# Patient Record
Sex: Male | Born: 1995
Health system: Southern US, Community
[De-identification: ages and names within clinical notes are randomized; demographics above are authoritative.]

## PROBLEM LIST (undated history)

## (undated) DIAGNOSIS — J302 Other seasonal allergic rhinitis: Secondary | ICD-10-CM

## (undated) DIAGNOSIS — Z87898 Personal history of other specified conditions: Secondary | ICD-10-CM

## (undated) DIAGNOSIS — F913 Oppositional defiant disorder: Secondary | ICD-10-CM

## (undated) DIAGNOSIS — R569 Unspecified convulsions: Secondary | ICD-10-CM

## (undated) DIAGNOSIS — F329 Major depressive disorder, single episode, unspecified: Secondary | ICD-10-CM

## (undated) DIAGNOSIS — T783XXA Angioneurotic edema, initial encounter: Secondary | ICD-10-CM

## (undated) DIAGNOSIS — F32A Depression, unspecified: Secondary | ICD-10-CM

## (undated) DIAGNOSIS — L509 Urticaria, unspecified: Secondary | ICD-10-CM

## (undated) DIAGNOSIS — Z973 Presence of spectacles and contact lenses: Secondary | ICD-10-CM

## (undated) HISTORY — DX: Other seasonal allergic rhinitis: J30.2

## (undated) HISTORY — DX: Unspecified convulsions: R56.9

## (undated) HISTORY — DX: Oppositional defiant disorder: F91.3

## (undated) HISTORY — DX: Personal history of other specified conditions: Z87.898

## (undated) HISTORY — DX: Major depressive disorder, single episode, unspecified: F32.9

## (undated) HISTORY — DX: Depression, unspecified: F32.A

## (undated) HISTORY — DX: Angioneurotic edema, initial encounter: T78.3XXA

## (undated) HISTORY — PX: OTHER SURGICAL HISTORY: SHX169

## (undated) HISTORY — DX: Presence of spectacles and contact lenses: Z97.3

## (undated) HISTORY — DX: Urticaria, unspecified: L50.9

---
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
## 2014-05-02 ENCOUNTER — Telehealth: Payer: Self-pay | Admitting: Family Medicine

## 2014-05-02 MED ORDER — PREDNISONE 20 MG PO TABS: ORAL_TABLET | ORAL | Status: DC

## 2014-05-02 NOTE — Telephone Encounter (Signed)
Patient says he has poison oak on his neck, waist, hands, arms, chest. Wants something called in please.  [facility]

## 2014-05-02 NOTE — Telephone Encounter (Signed)
Itchy Rash started thurs. Was exposed to poison oak.

## 2014-05-02 NOTE — Telephone Encounter (Signed)
Nurses, and there are options regarding poison oak certainly can do a steroid cream or steroid tablets. Steroid tablets work faster. If he would like to do that please do 9 days standard taper. 20 mg,3qd for 3d then 2qd for 3d then 1qd for 3d Followup if ongoing trouble

## 2014-05-02 NOTE — Telephone Encounter (Signed)
Medication sent to pharmacy. Patient was notified.  

## 2015-01-11 ENCOUNTER — Encounter: Payer: Self-pay | Admitting: Family Medicine

## 2015-01-11 ENCOUNTER — Ambulatory Visit (INDEPENDENT_AMBULATORY_CARE_PROVIDER_SITE_OTHER): Payer: BLUE CROSS/BLUE SHIELD | Admitting: Family Medicine

## 2015-01-11 VITALS — BP 112/72 | Ht 66.75 in | Wt 149.0 lb

## 2015-01-11 DIAGNOSIS — Z0000 Encounter for general adult medical examination without abnormal findings: Secondary | ICD-10-CM | POA: Diagnosis not present

## 2015-01-11 NOTE — Progress Notes (Signed)
   Subjective:    Patient ID: [name], male    DOB: 16-Feb-1996, 19 y.o.   MRN: [id number]  HPI The patient comes in today for a wellness visit.  A review of their health history was completed.  A review of medications was also completed.  Any needed refills: No  Eating habits: Health conscious  Falls/  MVA accidents in past few months: No  Regular exercise: Yes  Specialist pt sees on regular basis: No  Preventative health issues were discussed.   Additional concerns: Needs physical form filled out     Review of Systems  Constitutional: Negative for fever, activity change and appetite change.  HENT: Negative for congestion and rhinorrhea.   Eyes: Negative for discharge.  Respiratory: Negative for cough and wheezing.   Cardiovascular: Negative for chest pain.  Gastrointestinal: Negative for vomiting, abdominal pain and blood in stool.  Genitourinary: Negative for frequency and difficulty urinating.  Musculoskeletal: Negative for neck pain.  Skin: Negative for rash.  Allergic/Immunologic: Negative for environmental allergies and food allergies.  Neurological: Negative for weakness and headaches.  Psychiatric/Behavioral: Negative for agitation.  All other systems reviewed and are negative.      Objective:   Physical Exam  Constitutional: He appears well-developed and well-nourished.  HENT:  Head: Normocephalic and atraumatic.  Right Ear: External ear normal.  Left Ear: External ear normal.  Nose: Nose normal.  Mouth/Throat: Oropharynx is clear and moist.  Eyes: EOM are normal. Pupils are equal, round, and reactive to light.  Neck: Normal range of motion. Neck supple. No thyromegaly present.  Cardiovascular: Normal rate, regular rhythm and normal heart sounds.   No murmur heard. Pulmonary/Chest: Effort normal and breath sounds normal. No respiratory distress. He has no wheezes.  Abdominal: Soft. Bowel sounds are normal. He exhibits no distension and no  mass. There is no tenderness.  Genitourinary: Penis normal.  Musculoskeletal: Normal range of motion. He exhibits no edema.  Lymphadenopathy:    He has no cervical adenopathy.  Neurological: He is alert. He exhibits normal muscle tone.  Skin: Skin is warm and dry. No erythema.  Psychiatric: He has a normal mood and affect. His behavior is normal. Judgment normal.  Vitals reviewed.         Assessment & Plan:  Impression 1 wellness exam #2 history of possible seizure 5 years ago. Negative workup including negative EEG and negative neurology referral. Absolutely no neurological symptoms since then plan diet discussed exercise discussed anticipatory guidance given. Physical form filled out WSL

## 2015-07-17 ENCOUNTER — Ambulatory Visit: Payer: BLUE CROSS/BLUE SHIELD | Admitting: Podiatry

## 2015-12-29 ENCOUNTER — Emergency Department (HOSPITAL_COMMUNITY)
Admission: EM | Admit: 2015-12-29 | Discharge: 2015-12-29 | Disposition: A | Discharge status: Home / Self Care | Payer: BLUE CROSS/BLUE SHIELD | Attending: Emergency Medicine | Admitting: Emergency Medicine

## 2015-12-29 ENCOUNTER — Encounter (HOSPITAL_COMMUNITY): Payer: Self-pay | Admitting: *Deleted

## 2015-12-29 DIAGNOSIS — F17200 Nicotine dependence, unspecified, uncomplicated: Secondary | ICD-10-CM | POA: Insufficient documentation

## 2015-12-29 DIAGNOSIS — G40909 Epilepsy, unspecified, not intractable, without status epilepticus: Secondary | ICD-10-CM | POA: Insufficient documentation

## 2015-12-29 DIAGNOSIS — F329 Major depressive disorder, single episode, unspecified: Secondary | ICD-10-CM | POA: Insufficient documentation

## 2015-12-29 DIAGNOSIS — R519 Headache, unspecified: Secondary | ICD-10-CM

## 2015-12-29 DIAGNOSIS — B349 Viral infection, unspecified: Secondary | ICD-10-CM | POA: Diagnosis not present

## 2015-12-29 DIAGNOSIS — R51 Headache: Secondary | ICD-10-CM | POA: Insufficient documentation

## 2015-12-29 LAB — CBC WITH DIFFERENTIAL/PLATELET
BASOS ABS: 0 10*3/uL (ref 0.0–0.1)
Basophils Relative: 0 %
EOS PCT: 1 %
Eosinophils Absolute: 0.1 10*3/uL (ref 0.0–0.7)
HEMATOCRIT: 43.3 % (ref 39.0–52.0)
HEMOGLOBIN: 15.7 g/dL (ref 13.0–17.0)
Lymphocytes Relative: 23 %
Lymphs Abs: 2 10*3/uL (ref 0.7–4.0)
MCH: 32.2 pg (ref 26.0–34.0)
MCHC: 36.3 g/dL — ABNORMAL HIGH (ref 30.0–36.0)
MCV: 88.7 fL (ref 78.0–100.0)
Monocytes Absolute: 0.5 10*3/uL (ref 0.1–1.0)
Monocytes Relative: 6 %
Neutro Abs: 6.1 10*3/uL (ref 1.7–7.7)
Neutrophils Relative %: 70 %
PLATELETS: 202 10*3/uL (ref 150–400)
RBC: 4.88 MIL/uL (ref 4.22–5.81)
RDW: 11.3 % — ABNORMAL LOW (ref 11.5–15.5)
WBC: 8.7 10*3/uL (ref 4.0–10.5)

## 2015-12-29 LAB — BASIC METABOLIC PANEL
ANION GAP: 8 (ref 5–15)
BUN: 14 mg/dL (ref 6–20)
CHLORIDE: 102 mmol/L (ref 101–111)
CO2: 28 mmol/L (ref 22–32)
Calcium: 8.8 mg/dL — ABNORMAL LOW (ref 8.9–10.3)
Creatinine, Ser: 0.97 mg/dL (ref 0.61–1.24)
GFR calc Af Amer: >60 mL/min (ref >60)
GLUCOSE: 95 mg/dL (ref 65–99)
POTASSIUM: 3.7 mmol/L (ref 3.5–5.1)
Sodium: 138 mmol/L (ref 135–145)

## 2015-12-29 LAB — RAPID STREP SCREEN (MED CTR MEBANE ONLY): STREPTOCOCCUS, GROUP A SCREEN (DIRECT): NEGATIVE

## 2015-12-29 MED ORDER — DIPHENHYDRAMINE HCL 25 MG PO CAPS
25.0000 mg | ORAL_CAPSULE | Freq: Once | ORAL | Status: AC
  Administered 2015-12-29: 25 mg via ORAL
  Filled 2015-12-29: qty 1

## 2015-12-29 MED ORDER — OXYCODONE-ACETAMINOPHEN 5-325 MG PO TABS: 1.0000 | ORAL_TABLET | ORAL | Status: DC | PRN

## 2015-12-29 MED ORDER — METOCLOPRAMIDE HCL 5 MG/ML IJ SOLN
10.0000 mg | Freq: Once | INTRAMUSCULAR | Status: AC
  Administered 2015-12-29: 10 mg via INTRAMUSCULAR
  Filled 2015-12-29: qty 2

## 2015-12-29 MED ORDER — KETOROLAC TROMETHAMINE 60 MG/2ML IM SOLN
60.0000 mg | Freq: Once | INTRAMUSCULAR | Status: AC
  Administered 2015-12-29: 60 mg via INTRAMUSCULAR
  Filled 2015-12-29: qty 2

## 2015-12-29 MED ORDER — OXYCODONE-ACETAMINOPHEN 5-325 MG PO TABS
1.0000 | ORAL_TABLET | Freq: Once | ORAL | Status: AC
  Administered 2015-12-29: 1 via ORAL
  Filled 2015-12-29: qty 1

## 2015-12-29 NOTE — Discharge Instructions (Signed)
General Headache Without Cause A headache is pain or discomfort felt around the head or neck area. There are many causes and types of headaches. In some cases, the cause may not be found.  HOME CARE  Managing Pain  Take over-the-counter and prescription medicines only as told by your doctor.  Lie down in a dark, quiet room when you have a headache.  If directed, apply ice to the head and neck area:  Put ice in a plastic bag.  Place a towel between your skin and the bag.  Leave the ice on for 20 minutes, 2-3 times per day.  Use a heating pad or hot shower to apply heat to the head and neck area as told by your doctor.  Keep lights dim if bright lights bother you or make your headaches worse. Eating and Drinking  Eat meals on a regular schedule.  Lessen how much alcohol you drink.  Lessen how much caffeine you drink, or stop drinking caffeine. General Instructions  Keep all follow-up visits as told by your doctor. This is important.  Keep a journal to find out if certain things bring on headaches. For example, write down:  What you eat and drink.  How much sleep you get.  Any change to your diet or medicines.  Relax by getting a massage or doing other relaxing activities.  Lessen stress.  Sit up straight. Do not tighten (tense) your muscles.  Do not use tobacco products. This includes cigarettes, chewing tobacco, or e-cigarettes. If you need help quitting, ask your doctor.  Exercise regularly as told by your doctor.  Get enough sleep. This often means 7-9 hours of sleep. GET HELP IF:  Your symptoms are not helped by medicine.  You have a headache that feels different than the other headaches.  You feel sick to your stomach (nauseous) or you throw up (vomit).  You have a fever. GET HELP RIGHT AWAY IF:   Your headache becomes really bad.  You keep throwing up.  You have a stiff neck.  You have trouble seeing.  You have trouble speaking.  You have  pain in the eye or ear.  Your muscles are weak or you lose muscle control.  You lose your balance or have trouble walking.  You feel like you will pass out (faint) or you pass out.  You have confusion.   This information is not intended to replace advice given to you by your health care provider. Make sure you discuss any questions you have with your health care provider.   Document Released: 07/16/2008 Document Revised: 06/28/2015 Document Reviewed: 01/30/2015 Elsevier Interactive Patient Education 2016 Else[employer] Inc.  Viral Infections A virus is a type of germ. Viruses can cause:  Minor sore throats.  Aches and pains.  Headaches.  Runny nose.  Rashes.  Watery eyes.  Tiredness.  Coughs.  Loss of appetite.  Feeling sick to your stomach (nausea).  Throwing up (vomiting).  Watery poop (diarrhea). HOME CARE   Only take medicines as told by your doctor.  Drink enough water and fluids to keep your pee (urine) clear or pale yellow. Sports drinks are a good choice.  Get plenty of rest and eat healthy. Soups and broths with crackers or rice are fine. GET HELP RIGHT AWAY IF:   You have a very bad headache.  You have shortness of breath.  You have chest pain or neck pain.  You have an unusual rash.  You cannot stop throwing up.  You have  watery poop that does not stop.  You cannot keep fluids down.  You or your child has a temperature by mouth above 102 F (38.9 C), not controlled by medicine.  Your baby is older than 3 months with a rectal temperature of 102 F (38.9 C) or higher.  Your baby is 47 months old or younger with a rectal temperature of 100.4 F (38 C) or higher. MAKE SURE YOU:   Understand these instructions.  Will watch this condition.  Will get help right away if you are not doing well or get worse.   This information is not intended to replace advice given to you by your health care provider. Make sure you discuss any questions you  have with your health care provider.   Document Released: 09/19/2008 Document Revised: 12/30/2011 Document Reviewed: 03/15/2015 Elsevier Interactive Patient Education [employer]

## 2015-12-29 NOTE — ED Notes (Addendum)
Pt c/o headache, cough, nausea, body aches and fever. Pt reports numbness in random areas of his body. X 2 days. Sore throat x 2 days ago which has subsided now.

## 2016-01-01 MED FILL — Oxycodone w/ Acetaminophen Tab 5-325 MG: ORAL | Qty: 6 | Status: AC

## 2016-01-01 NOTE — ED Provider Notes (Signed)
CSN: [id number]     Arrival date & time 12/29/15  1846 History   First MD Initiated Contact with Patient 12/29/15 2054     Chief Complaint  Patient presents with  . Headache     (Consider location/radiation/quality/duration/timing/severity/associated sxs/prior Treatment) HPI   [name] is a 20 y.o. male who presents to the Emergency Department complaining of sore throat, cough, generalized body aches and fever that began two days prior.  He states that sore throat has improved.  He continues to report intermittent cough, nausea and waxing and waning headache.  He describes a throbbing frontal headache that feels like it wraps around his head and associated with photophobia.  He has not taken any medications for symptom relief.  He denies dizziness, neck pain or stiffness, shortness of breath, chest or abd pain.     Past Medical History  Diagnosis Date  . Oppositional defiant disorder   . Depression   . Wears glasses   . Seizures (HCC)     sinle seizure oct/2011  . Seasonal allergies    Past Surgical History  Procedure Laterality Date  . Sugery for arm fracture age 20     Family History  Problem Relation Age of Onset  . Alcohol abuse Father   . Depression Paternal Grandmother    Social History  Substance Use Topics  . Smoking status: Never Smoker   . Smokeless tobacco: Current User    Types: Chew  . Alcohol Use: No    Review of Systems  Constitutional: Positive for fever and chills. Negative for activity change and appetite change.  HENT: Positive for congestion and sore throat. Negative for facial swelling, rhinorrhea and trouble swallowing.   Eyes: Negative for visual disturbance.  Respiratory: Positive for cough. Negative for shortness of breath, wheezing and stridor.   Gastrointestinal: Positive for nausea. Negative for vomiting and abdominal pain.  Musculoskeletal: Positive for myalgias. Negative for neck pain and neck stiffness.  Skin: Negative for  color change and rash.  Neurological: Negative for dizziness, weakness, numbness and headaches.  Hematological: Negative for adenopathy.  Psychiatric/Behavioral: Negative for confusion.  All other systems reviewed and are negative.     Allergies  Review of patient's allergies indicates no known allergies.  Home Medications   Prior to Admission medications   Medication Sig Start Date End Date Taking? Authorizing Provider  oxyCODONE-acetaminophen (PERCOCET/ROXICET) 5-325 MG tablet Take 1 tablet by mouth every 4 (four) hours as needed. 12/29/15   Janesha Brissette, PA-C   BP 116/50 mmHg  Pulse 94  Temp(Src) 99.4 F (37.4 C) (Oral)  Resp 20  Ht 5\' 8"  (1.727 m)  Wt 72.576 kg  BMI 24.33 kg/m2  SpO2 94% Physical Exam  Constitutional: He is oriented to person, place, and time. He appears well-developed and well-nourished. No distress.  HENT:  Head: Normocephalic and atraumatic.  Right Ear: Tympanic membrane and ear canal normal.  Left Ear: Tympanic membrane and ear canal normal.  Nose: Mucosal edema present. No rhinorrhea.  Mouth/Throat: Uvula is midline and mucous membranes are normal. No trismus in the jaw. No uvula swelling. Posterior oropharyngeal erythema present. No oropharyngeal exudate, posterior oropharyngeal edema or tonsillar abscesses.  Eyes: Conjunctivae are normal.  Neck: Normal range of motion, full passive range of motion without pain and phonation normal. Neck supple. No Kernig's sign noted.  Cardiovascular: Normal rate, regular rhythm, normal heart sounds and intact distal pulses.   No murmur heard. Pulmonary/Chest: Effort normal and breath sounds normal. No respiratory distress.  He has no wheezes. He has no rales.  Abdominal: Soft. He exhibits no distension. There is no tenderness. There is no rebound and no guarding.  Musculoskeletal: Normal range of motion. He exhibits no edema.  Lymphadenopathy:    He has no cervical adenopathy.  Neurological: He is alert and  oriented to person, place, and time. He exhibits normal muscle tone. Coordination normal.  Skin: Skin is warm and dry.  Nursing note and vitals reviewed.   ED Course  Procedures (including critical care time) Labs Review Labs Reviewed  CBC WITH DIFFERENTIAL/PLATELET - Abnormal; Notable for the following:    MCHC 36.3 (*)    RDW 11.3 (*)    All other components within normal limits  BASIC METABOLIC PANEL - Abnormal; Notable for the following:    Calcium 8.8 (*)    All other components within normal limits  RAPID STREP SCREEN (NOT AT [facility])  CULTURE, GROUP A STREP [facility])    Imaging Review No results found. I have personally reviewed and evaluated these images and lab results as part of my medical decision-making.    MDM   Final diagnoses:  Acute nonintractable headache, unspecified headache type  Viral illness    Pt is well appearing, non-toxic.  Sx's c/w viral illness.  No nuchal rigidity.  He is feeling better after medications, headache improves.   ambulates with a steady gait.    Pt agrees to symptomatic tx and close PMD f/u.  Return precautions given  Pauline Aus, PA-C 01/01/16 2243  Blane Ohara, MD 01/03/16 [phone]

## 2016-01-02 LAB — CULTURE, GROUP A STREP (THRC)

## 2016-01-10 ENCOUNTER — Ambulatory Visit (INDEPENDENT_AMBULATORY_CARE_PROVIDER_SITE_OTHER): Payer: BLUE CROSS/BLUE SHIELD | Admitting: Family Medicine

## 2016-01-10 ENCOUNTER — Encounter: Payer: Self-pay | Admitting: Family Medicine

## 2016-01-10 VITALS — BP 118/80 | Ht 66.75 in | Wt 160.1 lb

## 2016-01-10 DIAGNOSIS — I889 Nonspecific lymphadenitis, unspecified: Secondary | ICD-10-CM

## 2016-01-10 MED ORDER — AZITHROMYCIN 250 MG PO TABS: ORAL_TABLET | ORAL | Status: DC

## 2016-01-10 NOTE — Progress Notes (Signed)
   Subjective:    Patient ID: Ronald AlbeeMatthew R Harmon, male    DOB: July 05, 1996, 20 y.o.   MRN: [id number]  HPI Patient is here today for a ED follow up visit. [facility] for headache. Patient states that he is having swelling on the left side of throat. Pain noted at times. Onset 2 weeks ago. Patient is concerned about thyroid issues because of the swelling in his neck.     ER report is reviewed.   Had flulike illness. Along with some discomfort granular swelling. Had an impressive headache 2. No longer sore throat. Glanular sign was primarily left side. Some cough of the time   Review of Systems  no current headache no chest pain no back pain no abdominal pain no change in bowel habits    Objective:   Physical Exam   alert vital stable HEENT slight prominent lymph node left anterior cervical chain slight tenderness pharynx normal thyroid normal lungs clear heart regular in rhythm.      Assessment & Plan:   impression very minimal lymphadenitis status post virus patient has numerous questions and attempted answered plan Z-Pak. Symptom care discussed expect slow resolution warning signs discussed WSL

## 2016-04-04 ENCOUNTER — Telehealth: Payer: Self-pay | Admitting: *Deleted

## 2016-04-04 DIAGNOSIS — Z139 Encounter for screening, unspecified: Secondary | ICD-10-CM

## 2016-04-04 NOTE — Telephone Encounter (Signed)
Let's do 

## 2016-04-04 NOTE — Telephone Encounter (Signed)
Pt requesting order for varicella titer for school.

## 2016-04-04 NOTE — Telephone Encounter (Signed)
Done pt notified.  

## 2016-04-10 LAB — VARICELLA ZOSTER ABS, IGG/IGM
Varicella IgM: <0.91 index (ref 0.00–0.90)
Varicella zoster IgG: <135 index — ABNORMAL LOW (ref >165)

## 2016-07-22 ENCOUNTER — Encounter: Payer: Self-pay | Admitting: Family Medicine

## 2016-07-22 ENCOUNTER — Ambulatory Visit (INDEPENDENT_AMBULATORY_CARE_PROVIDER_SITE_OTHER): Payer: Managed Care, Other (non HMO) | Admitting: Family Medicine

## 2016-07-22 VITALS — BP 126/70 | Temp 97.9°F | Ht 66.75 in | Wt 160.0 lb

## 2016-07-22 DIAGNOSIS — J329 Chronic sinusitis, unspecified: Secondary | ICD-10-CM | POA: Diagnosis not present

## 2016-07-22 MED ORDER — AMOXICILLIN-POT CLAVULANATE 875-125 MG PO TABS: 1.0000 | ORAL_TABLET | Freq: Two times a day (BID) | ORAL | 0 refills | Status: DC

## 2016-07-22 NOTE — Progress Notes (Signed)
   Subjective:    Patient ID: Ronald AlbeeMatthew R Mcnay, male    DOB: 06-16-1996, 20 y.o.   MRN: [id number]  Cough  This is a new problem. The current episode started in the past 7 days. The cough is productive of sputum. Associated symptoms include a sore throat and wheezing. Associated symptoms comments: Hoarseness . 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 vapes some     Patient states no other   Review of Systems  HENT: Positive for sore throat.   Respiratory: Positive for cough and wheezing.        Objective:   Physical Exam Alert, mild malaise. Hydration good Vitals stable. frontal/ maxillary tenderness evident positive nasal congestion. pharynx normal neck supple  lungs clear/no crackles or wheezes. heart regular in rhythm        Assessment & Plan:  Impression rhinosinusitis likely post viral, discussed with patient. plan antibiotics prescribed. Questions answered. Symptomatic care discussed. warning signs discussed. WSL

## 2016-07-30 ENCOUNTER — Telehealth: Payer: Self-pay | Admitting: Family Medicine

## 2016-07-30 MED ORDER — AZITHROMYCIN 250 MG PO TABS: ORAL_TABLET | ORAL | 0 refills | Status: DC

## 2016-07-30 NOTE — Telephone Encounter (Signed)
Patient seen on 07/22/16.  He says he was prescribed amoxicillin-clavulanate (AUGMENTIN) 875-125 MG tablet and he has broke out in hives all over.  Please advise.  [facility]

## 2016-07-30 NOTE — Telephone Encounter (Signed)
Patient called to check on this

## 2016-07-30 NOTE — Telephone Encounter (Signed)
Document pcn allergy, take benadryl prn for the hives, if still symtomatic from thre infxn can rx a z pk

## 2016-07-30 NOTE — Telephone Encounter (Signed)
Notified patient take benadryl prn for the hives, if still symptomatic from the infection can prescribe a z pak. [facility] [address] [facility] [place] patient request. Augmentin documented as allergy

## 2016-07-31 ENCOUNTER — Ambulatory Visit (INDEPENDENT_AMBULATORY_CARE_PROVIDER_SITE_OTHER): Payer: Managed Care, Other (non HMO) | Admitting: Family Medicine

## 2016-07-31 ENCOUNTER — Encounter: Payer: Self-pay | Admitting: Family Medicine

## 2016-07-31 VITALS — BP 122/70 | Temp 98.6°F | Ht 66.75 in | Wt 160.0 lb

## 2016-07-31 DIAGNOSIS — T887XXA Unspecified adverse effect of drug or medicament, initial encounter: Secondary | ICD-10-CM | POA: Diagnosis not present

## 2016-07-31 DIAGNOSIS — T50905A Adverse effect of unspecified drugs, medicaments and biological substances, initial encounter: Secondary | ICD-10-CM

## 2016-07-31 MED ORDER — PREDNISONE 20 MG PO TABS: ORAL_TABLET | ORAL | 0 refills | Status: DC

## 2016-07-31 NOTE — Progress Notes (Signed)
   Subjective:    Patient ID: Ronald AlbeeMatthew R Harmon, male    DOB: 07/27/1996, 20 y.o.   MRN: [id number]  Rash  This is a new problem. The current episode started yesterday. Location: all over. The rash is characterized by itchiness and redness. He was exposed to a new medication (augmentin). Treatments tried: benadryl.   This patient was placed on Augmentin. About day 8 day 9 he started having unusual rash. He was not having any throat swelling. He was not having any difficulty breathing. He states the rash itches some but not intensely. Started on the arms then went to do chest abdomen than the back. He was treated with Benadryl but he states that that caused him to feel drowsiness at that point he called today to be seen regarding this.   Review of Systems  Skin: Positive for rash.  Denies any fevers.     Objective:   Physical Exam Lungs are clear hearts regular throat no swelling noted patient has a large amount of erythematous patches that blanch. No true angioedema.       Assessment & Plan:  Drug reaction rash I would categorize this as variant on allergy I recommend to the patient to never take Augmentin again Prednisone taper Warning signs regarding complications were discussed in detail Loratadine daily for the next several days. Report back to us if having any complications or

## 2016-08-24 ENCOUNTER — Emergency Department (HOSPITAL_COMMUNITY)
Admission: EM | Admit: 2016-08-24 | Discharge: 2016-08-24 | Disposition: A | Discharge status: Home / Self Care | Payer: Managed Care, Other (non HMO) | Attending: Emergency Medicine | Admitting: Emergency Medicine

## 2016-08-24 ENCOUNTER — Encounter (HOSPITAL_COMMUNITY): Payer: Self-pay | Admitting: Emergency Medicine

## 2016-08-24 DIAGNOSIS — F913 Oppositional defiant disorder: Secondary | ICD-10-CM | POA: Diagnosis not present

## 2016-08-24 DIAGNOSIS — L509 Urticaria, unspecified: Secondary | ICD-10-CM | POA: Insufficient documentation

## 2016-08-24 DIAGNOSIS — Z792 Long term (current) use of antibiotics: Secondary | ICD-10-CM | POA: Diagnosis not present

## 2016-08-24 DIAGNOSIS — F17220 Nicotine dependence, chewing tobacco, uncomplicated: Secondary | ICD-10-CM | POA: Insufficient documentation

## 2016-08-24 DIAGNOSIS — R21 Rash and other nonspecific skin eruption: Secondary | ICD-10-CM | POA: Diagnosis present

## 2016-08-24 MED ORDER — METHYLPREDNISOLONE SODIUM SUCC 125 MG IJ SOLR
125.0000 mg | Freq: Once | INTRAMUSCULAR | Status: AC
  Administered 2016-08-24: 125 mg via INTRAMUSCULAR
  Filled 2016-08-24: qty 2

## 2016-08-24 MED ORDER — DIPHENHYDRAMINE HCL 25 MG PO CAPS
50.0000 mg | ORAL_CAPSULE | Freq: Once | ORAL | Status: DC
  Filled 2016-08-24: qty 2

## 2016-08-24 MED ORDER — FAMOTIDINE 20 MG PO TABS
20.0000 mg | ORAL_TABLET | Freq: Once | ORAL | Status: AC
  Administered 2016-08-24: 20 mg via ORAL
  Filled 2016-08-24: qty 1

## 2016-08-24 NOTE — ED Provider Notes (Signed)
[facility]-EM[facility]ENCY DEPT Provider Note   CSN: [id number] Arrival date & time: 08/24/16  1122     History   Chief Complaint Chief Complaint  Patient presents with  . Rash    HPI Ronald AlbeeMatthew R Kassa is a 20 y.o. male.  The history is provided by the patient. No language interpreter was used.  Rash   This is a recurrent problem. The problem has been gradually worsening. The problem is associated with nothing. There has been no fever. The rash is present on the left arm, right arm and face. The pain is moderate. The pain has been constant since onset. He has tried nothing for the symptoms. The treatment provided no relief.  Pt complains of a rash of arms and elbows.  Pt reports he developed hives a few weeks ago after taking amoxicilian  Past Medical History:  Diagnosis Date  . Depression   . Oppositional defiant disorder   . Seasonal allergies   . Seizures (HCC)    sinle seizure oct/2011  . Wears glasses     Patient Active Problem List   Diagnosis Date Noted  . Depression, major, in remission (HCC) 09/04/2011  . ODD (oppositional defiant disorder) 09/04/2011    Past Surgical History:  Procedure Laterality Date  . sugery for arm fracture age 20         Home Medications    Prior to Admission medications   Medication Sig Start Date End Date Taking? Authorizing Provider  azithromycin (ZITHROMAX Z-PAK) 250 MG tablet Take 2 tablets (500 mg) on  Day 1,  followed by 1 tablet (250 mg) once daily on Days 2 through 5. 07/30/16   Merlyn AlbertWilliam S Luking, MD  predniSONE (DELTASONE) 20 MG tablet 3qd for 3d then 2qd for 3d then 1qd for 3d 07/31/16   Babs SciaraScott A Luking, MD    Family History Family History  Problem Relation Age of Onset  . Alcohol abuse Father   . Depression Paternal Grandmother     Social History Social History  Substance Use Topics  . Smoking status: Never Smoker  . Smokeless tobacco: Current User    Types: Chew  . Alcohol use 0.0 oz/week     Comment: occas      Allergies   Augmentin [amoxicillin-pot clavulanate]   Review of Systems Review of Systems  Skin: Positive for rash.  All other systems reviewed and are negative.    Physical Exam Updated Vital Signs BP 132/75 (BP Location: Right Arm)   Pulse 69   Temp 97.8 F (36.6 C) (Oral)   Resp 16   Ht 5\' 7"  (1.702 m)   Wt 74.8 kg   SpO2 100%   BMI 25.84 kg/m   Physical Exam  Constitutional: He is oriented to person, place, and time. He appears well-developed and well-nourished.  HENT:  Head: Normocephalic.  Eyes: EOM are normal.  Neck: Normal range of motion.  Pulmonary/Chest: Effort normal.  Abdominal: He exhibits no distension.  Musculoskeletal: Normal range of motion.  Neurological: He is alert and oriented to person, place, and time.  Skin: Rash noted. There is erythema.  Red raised erythematous area,  Most concentrated on elbows.  Psychiatric: He has a normal mood and affect.  Nursing note and vitals reviewed.    ED Treatments / Results  Labs (all labs ordered are listed, but only abnormal results are displayed) Labs Reviewed - No data to display  EKG  EKG Interpretation None       Radiology No results found.  Procedures Procedures (including critical care time)  Medications Ordered in ED Medications  methylPREDNISolone sodium succinate (SOLU-MEDROL) 125 mg/2 mL injection 125 mg (not administered)  diphenhydrAMINE (BENADRYL) capsule 50 mg (not administered)  famotidine (PEPCID) tablet 20 mg (not administered)     Initial Impression / Assessment and Plan / ED Course  I have reviewed the triage vital signs and the nursing notes.  Pertinent labs & imaging results that were available during my care of the patient were reviewed by me and considered in my medical decision making (see chart for details).  Clinical Course    Solumedrol IM Pepcid Benadryl  Pt advised to schedule to see allergist for evaluation   Final Clinical Impressions(s) /  ED Diagnoses   Final diagnoses:  Urticaria    New Prescriptions New Prescriptions   No medications on file  An After Visit Summary was printed and given to the patient.   Elson AreasLeslie K Fannye Myer, PA-C 08/24/16 1227    Doug SouSam Jacubowitz, MD 08/24/16 1600

## 2016-08-24 NOTE — ED Triage Notes (Signed)
Pt reports he started having rash last night. Pt has rash to his R hand with edema. Rash to his arms, hips and neck. Pt denies difficulty breathing.

## 2016-08-26 ENCOUNTER — Telehealth: Payer: Self-pay | Admitting: Family Medicine

## 2016-08-26 MED ORDER — PREDNISONE 20 MG PO TABS
ORAL_TABLET | ORAL | 0 refills | Status: DC
Start: 2016-08-26 — End: 2017-07-29

## 2016-08-26 MED ORDER — PREDNISONE 20 MG PO TABS: ORAL_TABLET | ORAL | 0 refills | Status: DC

## 2016-08-26 NOTE — Telephone Encounter (Signed)
Pt called stating that he is broke out in hives again and is unable to come in. Pt is wanting to know if prednisone can be called in.    [facility] [address] [address] [place]

## 2016-08-26 NOTE — Telephone Encounter (Signed)
Patient notified med sent to pharmacy.  

## 2016-08-26 NOTE — Telephone Encounter (Signed)
Ok adult taper

## 2016-09-02 ENCOUNTER — Encounter: Payer: Self-pay | Admitting: Allergy and Immunology

## 2016-09-02 ENCOUNTER — Ambulatory Visit (INDEPENDENT_AMBULATORY_CARE_PROVIDER_SITE_OTHER): Payer: Managed Care, Other (non HMO) | Admitting: Allergy and Immunology

## 2016-09-02 ENCOUNTER — Encounter (INDEPENDENT_AMBULATORY_CARE_PROVIDER_SITE_OTHER): Payer: Self-pay

## 2016-09-02 VITALS — BP 122/84 | HR 84 | Temp 97.6°F | Resp 18 | Ht 65.5 in | Wt 157.0 lb

## 2016-09-02 DIAGNOSIS — T7800XA Anaphylactic reaction due to unspecified food, initial encounter: Secondary | ICD-10-CM | POA: Insufficient documentation

## 2016-09-02 DIAGNOSIS — T783XXA Angioneurotic edema, initial encounter: Secondary | ICD-10-CM

## 2016-09-02 DIAGNOSIS — L500 Allergic urticaria: Secondary | ICD-10-CM | POA: Diagnosis not present

## 2016-09-02 DIAGNOSIS — J3089 Other allergic rhinitis: Secondary | ICD-10-CM | POA: Diagnosis not present

## 2016-09-02 DIAGNOSIS — T7840XA Allergy, unspecified, initial encounter: Secondary | ICD-10-CM

## 2016-09-02 DIAGNOSIS — K2970 Gastritis, unspecified, without bleeding: Secondary | ICD-10-CM

## 2016-09-02 HISTORY — DX: Angioneurotic edema, initial encounter: T78.3XXA

## 2016-09-02 MED ORDER — LEVOCETIRIZINE DIHYDROCHLORIDE 5 MG PO TABS: 5.0000 mg | ORAL_TABLET | Freq: Every day | ORAL | 5 refills | Status: DC

## 2016-09-02 MED ORDER — EPINEPHRINE 0.3 MG/0.3ML IJ SOAJ: 0.3000 mg | Freq: Once | INTRAMUSCULAR | 2 refills | Status: AC

## 2016-09-02 MED ORDER — MOMETASONE FUROATE 50 MCG/ACT NA SUSP: 2.0000 | Freq: Every day | NASAL | 5 refills | Status: DC

## 2016-09-02 MED ORDER — MONTELUKAST SODIUM 10 MG PO TABS: 10.0000 mg | ORAL_TABLET | Freq: Every day | ORAL | 5 refills | Status: DC

## 2016-09-02 NOTE — Progress Notes (Signed)
New Patient Note  RE: [name] MRN: [id number] DOB: 1996-06-13 Date of Office Visit: 09/02/2016  Referring provider: Orlie Dakin, MD Primary care provider: Mickie Hillier, MD  Chief Complaint: Allergic Reaction; Urticaria; and Angioedema   History of present illness: [name] is a 20 y.o. male seen today in consultation requested by Orlie Dakin, MD.  He reports that 11 days ago he developed lower extremity pruritus.  He took diphenhydramine and the symptoms resolved.  However, the next day he experienced itchy hands, hand swelling, and plaque like hives on his upper extremities.  The symptoms once again responded to diphenhydramine, however when he went to work the next day he developed similar symptoms and went to the emergency department and was treated with systemic steroids, diphenhydramine, and famotidine.  Since that time, he has experienced recurrent hives on his upper extremities, abdomen, neck, and scalp.  Individual hives resolve completely within 24 hours without residual pigmentation or bruising.  He has experienced a few episodes of concomitant lip swelling and possibly the sensation of mild throat swelling.  No specific medication, food, or environmental triggers have been identified.  He notes that in early October he had a sinus infection requiring antibiotics.  Approximately 7 days after starting Augmentin he developed generalized urticaria which resolved with prednisone.  The sinus infection was then successfully treated with azithromycin. [name] experiences nasal congestion, rhinorrhea, and ocular pruritus.  The symptoms are most prominent in the late summer/fall.   Assessment and plan: Allergic reaction [name]'s history suggests allergic reaction with an unclear trigger versus chronic idiopathic urticaria with associated angioedema. Food allergen skin tests were negative today despite a positive histamine control. The negative predictive value for  skin tests is excellent (greater than 95%). We will proceed with in vitro lab studies to clarify the etiology.  The following labs have been ordered: FCeRI antibody, TSH, anti-thyroglobulin antibody, thyroid peroxidase antibody, baseline serum tryptase, CBC, CMP, ESR, ANA, and serum specific IgE against galactose-alpha-1,3-galactose. An additional lab order for serum tryptase has been provided which is to be kept by the patient to be drawn in the emergency department within 4 hours of symptom onset should symptoms recur.  Instructions have been discussed and provided for H1/H2 receptor blockade with titration to find lowest effective dose.  A prescription has been provided for levocetirizine, 2m daily as needed.  A prescription has been provided for montelukast 10 mg daily at bedtime.  Should significant or new symptoms recur, the patient has been asked to keep a journal to record any foods eaten, beverages consumed, medications taken within a 6 hour period prior to the onset of symptoms, as well as record activities being performed, and environmental conditions. For any symptoms concerning for anaphylaxis, epinephrine is to be administered and 911 is to be called immediately.  A prescription has been provided for epinephrine 0.3 mg autoinjector 2 pack along with instructions for its proper administration.  Seasonal and perennial allergic rhinitis  Aeroallergen avoidance measures have been discussed and provided in written form.  A prescription has been provided for levocetirizine and montelukast (as above).  A prescription has been provided for Nasonex nasal spray, one spray per nostril 1-2 times daily as needed. Proper nasal spray technique has been discussed and demonstrated.   Meds ordered this encounter  Medications  . EPINEPHrine (EPIPEN 2-PAK) 0.3 mg/0.3 mL IJ SOAJ injection    Sig: Inject 0.3 mLs (0.3 mg total) into the muscle once.    Dispense:  2 Device  Refill:  2     Dispense M[employer] Generic if brand is not covered.  . montelukast (SINGULAIR) 10 MG tablet    Sig: Take 1 tablet (10 mg total) by mouth at bedtime.    Dispense:  30 tablet    Refill:  5  . levocetirizine (XYZAL) 5 MG tablet    Sig: Take 1 tablet (5 mg total) by mouth daily.    Dispense:  30 tablet    Refill:  5  . mometasone (NASONEX) 50 MCG/ACT nasal spray    Sig: Place 2 sprays into the nose daily.    Dispense:  17 g    Refill:  5    Diagnostics: Environmental skin testing: Positive to tree pollen, molds, and dust mite antigen. Food allergen skin testing:  Negative despite a positive histamine control.    Physical examination: Blood pressure 122/84, pulse 84, temperature 97.6 F (36.4 C), temperature source Oral, resp. rate 18, height 5' 5.5" (1.664 m), weight 157 lb (71.2 kg), SpO2 95 %.  General: Alert, interactive, in no acute distress. HEENT: TMs pearly gray, turbinates mildly edematous without discharge, post-pharynx mildly erythematous. Neck: Supple without lymphadenopathy. Lungs: Clear to auscultation without wheezing, rhonchi or rales. CV: Normal S1, S2 without murmurs. Abdomen: Nondistended, nontender. Skin: Warm and dry, without lesions or rashes. Extremities:  No clubbing, cyanosis or edema. Neuro:   Grossly intact.  Review of systems:  Review of systems negative except as noted in HPI / PMHx or noted below: Review of Systems  Constitutional: Negative.   HENT: Negative.   Eyes: Negative.   Respiratory: Negative.   Cardiovascular: Negative.   Gastrointestinal: Negative.   Genitourinary: Negative.   Musculoskeletal: Negative.   Skin: Negative.   Neurological: Negative.   Endo/Heme/Allergies: Negative.   Psychiatric/Behavioral: Negative.     Past medical history:  Past Medical History:  Diagnosis Date  . Angioedema 09/02/2016  . Depression   . Oppositional defiant disorder   . Seasonal allergies   . Seizures ([place])    sinle seizure oct/2011  .  Urticaria   . Wears glasses     Past surgical history:  Past Surgical History:  Procedure Laterality Date  . sugery for arm fracture age 52      Family history: Family History  Problem Relation Age of Onset  . Alcohol abuse Father   . Depression Paternal Grandmother   . Allergies Mother   . Allergies Sister     Social history: Social History   Social History  . Marital status: Single    Spouse name: N/A  . Number of children: N/A  . Years of education: N/A   Occupational History  . Not on file.   Social History Main Topics  . Smoking status: Never Smoker  . Smokeless tobacco: Current User    Types: Chew  . Alcohol use 0.0 oz/week     Comment: occas  . Drug use: No  . Sexual activity: No   Other Topics Concern  . Not on file   Social History Narrative  . No narrative on file   Environmental History: The patient lives in a 21 year old home with laminate floors throughout and central air/heat.  He has a cat which is kept outdoors.  He is a nonsmoker.    Medication List       Accurate as of 09/02/16 11:59 PM. Always use your most recent med list.          azithromycin 250 MG tablet Commonly known as:  ZITHROMAX Z-PAK Take 2 tablets (500 mg) on  Day 1,  followed by 1 tablet (250 mg) once daily on Days 2 through 5.   EPINEPHrine 0.3 mg/0.3 mL Soaj injection Commonly known as:  EPIPEN 2-PAK Inject 0.3 mLs (0.3 mg total) into the muscle once.   levocetirizine 5 MG tablet Commonly known as:  XYZAL Take 1 tablet (5 mg total) by mouth daily.   mometasone 50 MCG/ACT nasal spray Commonly known as:  NASONEX Place 2 sprays into the nose daily.   montelukast 10 MG tablet Commonly known as:  SINGULAIR Take 1 tablet (10 mg total) by mouth at bedtime.   predniSONE 20 MG tablet Commonly known as:  DELTASONE 3qd for 3d then 2qd for 3d then 1qd for 2d       Known medication allergies: Allergies  Allergen Reactions  . Augmentin [Amoxicillin-Pot  Clavulanate]     I appreciate the opportunity to take part in [name]'s care. Please do not hesitate to contact me with questions.  Sincerely,   R. Edgar Frisk, MD

## 2016-09-02 NOTE — Patient Instructions (Addendum)
Allergic reaction Hyder's history suggests allergic reaction with an unclear trigger versus chronic idiopathic urticaria with associated angioedema. Food allergen skin tests were negative today despite a positive histamine control. The negative predictive value for skin tests is excellent (greater than 95%). We will proceed with in vitro lab studies to clarify the etiology.  The following labs have been ordered: FCeRI antibody, TSH, anti-thyroglobulin antibody, thyroid peroxidase antibody, baseline serum tryptase, CBC, CMP, ESR, ANA, and serum specific IgE against galactose-alpha-1,3-galactose. An additional lab order for serum tryptase has been provided which is to be kept by the patient to be drawn in the emergency department within 4 hours of symptom onset should symptoms recur.  Instructions have been discussed and provided for H1/H2 receptor blockade with titration to find lowest effective dose.  A prescription has been provided for levocetirizine, 73m daily as needed.  A prescription has been provided for montelukast 10 mg daily at bedtime.  Should significant or new symptoms recur, the patient has been asked to keep a journal to record any foods eaten, beverages consumed, medications taken within a 6 hour period prior to the onset of symptoms, as well as record activities being performed, and environmental conditions. For any symptoms concerning for anaphylaxis, epinephrine is to be administered and 911 is to be called immediately.  A prescription has been provided for epinephrine 0.3 mg autoinjector 2 pack along with instructions for its proper administration.  Seasonal and perennial allergic rhinitis  Aeroallergen avoidance measures have been discussed and provided in written form.  A prescription has been provided for levocetirizine and montelukast (as above).  A prescription has been provided for Nasonex nasal spray, one spray per nostril 1-2 times daily as needed. Proper nasal  spray technique has been discussed and demonstrated.   When lab results have returned the patient will be called with further recommendations and follow up instructions.  Reducing Pollen Exposure  The American Academy of Allergy, Asthma and Immunology suggests the following steps to reduce your exposure to pollen during allergy seasons.    1. Do not hang sheets or clothing out to dry; pollen may collect on these items. 2. Do not mow lawns or spend time around freshly cut grass; mowing stirs up pollen. 3. Keep windows closed at night.  Keep car windows closed while driving. 4. Minimize morning activities outdoors, a time when pollen counts are usually at their highest. 5. Stay indoors as much as possible when pollen counts or humidity is high and on windy days when pollen tends to remain in the air longer. 6. Use air conditioning when possible.  Many air conditioners have filters that trap the pollen spores. 7. Use a HEPA room air filter to remove pollen form the indoor air you breathe.   Control of Mold Allergen  Mold and fungi can grow on a variety of surfaces provided certain temperature and moisture conditions exist.  Outdoor molds grow on plants, decaying vegetation and soil.  The major outdoor mold, Alternaria and Cladosporium, are found in very high numbers during hot and dry conditions.  Generally, a late Summer - Fall peak is seen for common outdoor fungal spores.  Rain will temporarily lower outdoor mold spore count, but counts rise rapidly when the rainy period ends.  The most important indoor molds are Aspergillus and Penicillium.  Dark, humid and poorly ventilated basements are ideal sites for mold growth.  The next most common sites of mold growth are the bathroom and the kitchen.  [employer]door [employer]. Use  air conditioning and keep windows closed 2. Avoid exposure to decaying vegetation. 3. Avoid leaf raking. 4. Avoid grain handling. 5. Consider wearing a face mask if  working in moldy areas.  Indoor Mold Control 1. Maintain humidity below 50%. 2. Clean washable surfaces with 5% bleach solution. 3. Remove sources e.g. Contaminated carpets.  Control of House Dust Mite Allergen  House dust mites play a major role in allergic asthma and rhinitis.  They occur in environments with high humidity wherever human skin, the food for dust mites is found. High levels have been detected in dust obtained from mattresses, pillows, carpets, upholstered furniture, bed covers, clothes and soft toys.  The principal allergen of the house dust mite is found in its feces.  A gram of dust may contain 1,000 mites and 250,000 fecal particles.  Mite antigen is easily measured in the air during house cleaning activities.    1. Encase mattresses, including the box spring, and pillow, in an air tight cover.  Seal the zipper end of the encased mattresses with wide adhesive tape. 2. Wash the bedding in water of 130 degrees Farenheit weekly.  Avoid cotton comforters/quilts and flannel bedding: the most ideal bed covering is the dacron comforter. 3. Remove all upholstered furniture from the bedroom. 4. Remove carpets, carpet padding, rugs, and non-washable window drapes from the bedroom.  Wash drapes weekly or use plastic window coverings. 5. Remove all non-washable stuffed toys from the bedroom.  Wash stuffed toys weekly. 6. Have the room cleaned frequently with a vacuum cleaner and a damp dust-mop.  The patient should not be in a room which is being cleaned and should wait 1 hour after cleaning before going into the room. 7. Close and seal all heating outlets in the bedroom.  Otherwise, the room will become filled with dust-laden air.  An electric heater can be used to heat the room. 8. Reduce indoor humidity to less than 50%.  Do not use a humidifier.

## 2016-09-02 NOTE — Assessment & Plan Note (Addendum)
[name]'s history suggests allergic reaction with an unclear trigger versus chronic idiopathic urticaria with associated angioedema. Food allergen skin tests were negative today despite a positive histamine control. The negative predictive value for skin tests is excellent (greater than 95%). We will proceed with in vitro lab studies to clarify the etiology.  The following labs have been ordered: FCeRI antibody, TSH, anti-thyroglobulin antibody, thyroid peroxidase antibody, baseline serum tryptase, CBC, CMP, ESR, ANA, and serum specific IgE against galactose-alpha-1,3-galactose. An additional lab order for serum tryptase has been provided which is to be kept by the patient to be drawn in the emergency department within 4 hours of symptom onset should symptoms recur.  Instructions have been discussed and provided for H1/H2 receptor blockade with titration to find lowest effective dose.  A prescription has been provided for levocetirizine, 55m daily as needed.  A prescription has been provided for montelukast 10 mg daily at bedtime.  Should significant or new symptoms recur, the patient has been asked to keep a journal to record any foods eaten, beverages consumed, medications taken within a 6 hour period prior to the onset of symptoms, as well as record activities being performed, and environmental conditions. For any symptoms concerning for anaphylaxis, epinephrine is to be administered and 911 is to be called immediately.  A prescription has been provided for epinephrine 0.3 mg autoinjector 2 pack along with instructions for its proper administration.

## 2016-09-02 NOTE — Assessment & Plan Note (Signed)
   Aeroallergen avoidance measures have been discussed and provided in written form.  A prescription has been provided for levocetirizine and montelukast (as above).  A prescription has been provided for Nasonex nasal spray, one spray per nostril 1-2 times daily as needed. Proper nasal spray technique has been discussed and demonstrated.

## 2016-09-03 ENCOUNTER — Telehealth: Payer: Self-pay

## 2016-09-03 DIAGNOSIS — L500 Allergic urticaria: Secondary | ICD-10-CM

## 2016-09-03 LAB — COMPREHENSIVE METABOLIC PANEL
ALT: 28 U/L (ref 9–46)
AST: 28 U/L (ref 10–40)
Albumin: 4.8 g/dL (ref 3.6–5.1)
Alkaline Phosphatase: 67 U/L (ref 40–115)
BUN: 11 mg/dL (ref 7–25)
CO2: 25 mmol/L (ref 20–31)
Calcium: 9.9 mg/dL (ref 8.6–10.3)
Chloride: 100 mmol/L (ref 98–110)
Creat: 0.99 mg/dL (ref 0.60–1.35)
Glucose, Bld: 85 mg/dL (ref 65–99)
Potassium: 4.1 mmol/L (ref 3.5–5.3)
Sodium: 138 mmol/L (ref 135–146)
Total Bilirubin: 0.5 mg/dL (ref 0.2–1.2)
Total Protein: 7.6 g/dL (ref 6.1–8.1)

## 2016-09-03 LAB — H. PYLORI BREATH TEST: H. pylori Breath Test: NOT DETECTED

## 2016-09-03 LAB — CBC WITH DIFFERENTIAL/PLATELET
Basophils Absolute: 0 cells/uL (ref 0–200)
Basophils Relative: 0 %
Eosinophils Absolute: 192 cells/uL (ref 15–500)
Eosinophils Relative: 2 %
HCT: 48.3 % (ref 38.5–50.0)
Hemoglobin: 16.6 g/dL (ref 13.2–17.1)
Lymphocytes Relative: 37 %
Lymphs Abs: 3552 cells/uL (ref 850–3900)
MCH: 31.6 pg (ref 27.0–33.0)
MCHC: 34.4 g/dL (ref 32.0–36.0)
MCV: 92 fL (ref 80.0–100.0)
MPV: 9.8 fL (ref 7.5–12.5)
Monocytes Absolute: 480 cells/uL (ref 200–950)
Monocytes Relative: 5 %
Neutro Abs: 5376 cells/uL (ref 1500–7800)
Neutrophils Relative %: 56 %
Platelets: 362 10*3/uL (ref 140–400)
RBC: 5.25 MIL/uL (ref 4.20–5.80)
RDW: 12.8 % (ref 11.0–15.0)
WBC: 9.6 10*3/uL (ref 3.8–10.8)

## 2016-09-03 LAB — TRYPTASE: Tryptase: 8.3 ug/L (ref <11)

## 2016-09-03 LAB — SEDIMENTATION RATE: Sed Rate: 1 mm/hr (ref 0–15)

## 2016-09-03 LAB — ANA: Anti Nuclear Antibody(ANA): NEGATIVE

## 2016-09-03 NOTE — Telephone Encounter (Signed)
[phone]  Spoke to William/CSR re lab test placed on 09/02/16:  ANA Reflex to ENA 9 Panel Test code# [id number] is inactive. New Test Code# is 23900.  Pt will need to have blood redrawn due to new test code requires plain red top drawn for test.    Contacted patient of need to have additional lab done due to the above. Patient will come by office to pick up new requisition tomorrow 09/04/2016.  Please sign off on new requisition placed on your door.  Thanks!

## 2016-09-04 ENCOUNTER — Ambulatory Visit: Payer: Managed Care, Other (non HMO) | Admitting: Allergy and Immunology

## 2016-09-04 NOTE — Telephone Encounter (Signed)
I left the [place] office yesterday at 5:15 (prior to the message being sent to me) and will not be back in the [place] office until next Monday. Please either have one of the doctors in [place] today sign it or fax it over to me at the [facility] office. Thanks.

## 2016-09-04 NOTE — Telephone Encounter (Signed)
Lab requisition placed up front for pick up.

## 2016-09-06 LAB — ALPHA-GAL PANEL
Beef IgE: 0.24 kU/L (ref <0.35)
Class: 0
Class: 0
Galactose-alpha-1,3-galactose IgE*: 0.55 kU/L — ABNORMAL HIGH (ref <0.35)
Lamb/Mutton IgE: <0.1 kU/L (ref <0.35)
Pork IgE: <0.1 kU/L (ref <0.35)

## 2016-09-09 LAB — CP CHRONIC URTICARIA INDEX PANEL
Histamine Release: <16 % (ref <16)
TSH: 1.74 mIU/L (ref 0.40–4.50)
Thyroglobulin Ab: <1 IU/mL (ref <2)
Thyroperoxidase Ab SerPl-aCnc: <1 IU/mL (ref <9)

## 2016-09-11 ENCOUNTER — Other Ambulatory Visit: Payer: Self-pay

## 2016-09-11 MED ORDER — EPINEPHRINE 0.3 MG/0.3ML IJ SOAJ
INTRAMUSCULAR | 2 refills | Status: DC
Start: 2016-09-11 — End: 2020-01-11

## 2016-09-16 ENCOUNTER — Telehealth: Payer: Self-pay | Admitting: Allergy and Immunology

## 2016-09-16 NOTE — Telephone Encounter (Signed)
Patient called and said he saw Dr. Nunzio Cobbsbobbitt on 11-13 and testing came back that he was positive for Alpha gal-meat allergies. He said he would like to talk to someone to get more information on this and what he is suppose to avoid.

## 2016-09-16 NOTE — Telephone Encounter (Signed)
Spoke with patient and answered his question regarding Alpha Gal.

## 2017-03-28 ENCOUNTER — Encounter (INDEPENDENT_AMBULATORY_CARE_PROVIDER_SITE_OTHER): Payer: Self-pay

## 2017-03-28 ENCOUNTER — Ambulatory Visit: Payer: Self-pay | Admitting: Physician Assistant

## 2017-03-28 ENCOUNTER — Encounter: Payer: Self-pay | Admitting: Physician Assistant

## 2017-03-28 VITALS — BP 110/70 | HR 73 | Temp 98.5°F | Resp 16 | Ht 68.0 in | Wt 158.0 lb

## 2017-03-28 DIAGNOSIS — Z0000 Encounter for general adult medical examination without abnormal findings: Secondary | ICD-10-CM

## 2017-03-28 NOTE — Progress Notes (Signed)
   Subjective: Physical exam    Patient ID: Rosealee AlbeeMatthew R Eisenhour, male    DOB: 1995/11/13, 21 y.o.   MRN: [id number]  HPI Patient present for physical exam. Voices no concerns.   Review of Systems    Negative Objective:   Physical Exam HEENT unremarkable. Neck supple w/o adenopathy. Lungs CTA and Heart RRR. Abdomen w/o HSN, normoactive BS, and SNTTP. No obvious upper/lower extremities deformity. F/E ROm and strength  5/5. No obvious spinal deformity. F/E ROM. CN II-XII grossly intact.       Assessment & Plan:Well exam  Follow up for lab results.

## 2017-03-28 NOTE — Addendum Note (Signed)
Addended by: Catha BrowEACON, Shante Maysonet T on: 03/28/2017 11:26 AM   Modules accepted: Orders

## 2017-03-29 LAB — CMP12+LP+TP+TSH+6AC+CBC/D/PLT
ALK PHOS: 63 IU/L (ref 39–117)
ALT: 28 IU/L (ref 0–44)
AST: 25 IU/L (ref 0–40)
Albumin/Globulin Ratio: 1.8 (ref 1.2–2.2)
Albumin: 4.8 g/dL (ref 3.5–5.5)
BASOS: 1 %
BUN/Creatinine Ratio: 13 (ref 9–20)
BUN: 15 mg/dL (ref 6–20)
Basophils Absolute: 0 10*3/uL (ref 0.0–0.2)
Bilirubin Total: 0.7 mg/dL (ref 0.0–1.2)
CALCIUM: 9.6 mg/dL (ref 8.7–10.2)
CHLORIDE: 104 mmol/L (ref 96–106)
CHOL/HDL RATIO: 3.6 ratio (ref 0.0–5.0)
CHOLESTEROL TOTAL: 160 mg/dL (ref 100–199)
Creatinine, Ser: 1.12 mg/dL (ref 0.76–1.27)
EOS (ABSOLUTE): 0.1 10*3/uL (ref 0.0–0.4)
ESTIMATED CHD RISK: 0.6 times avg. (ref 0.0–1.0)
Eos: 2 %
FREE THYROXINE INDEX: 1.9 (ref 1.2–4.9)
GFR calc Af Amer: 107 mL/min/{1.73_m2} (ref >59)
GFR calc non Af Amer: 93 mL/min/{1.73_m2} (ref >59)
GGT: 18 IU/L (ref 0–65)
Globulin, Total: 2.7 g/dL (ref 1.5–4.5)
Glucose: 80 mg/dL (ref 65–99)
HDL: 45 mg/dL (ref >39)
HEMATOCRIT: 45.8 % (ref 37.5–51.0)
Hemoglobin: 15.6 g/dL (ref 13.0–17.7)
Immature Grans (Abs): 0 10*3/uL (ref 0.0–0.1)
Immature Granulocytes: 0 %
Iron: 90 ug/dL (ref 38–169)
LDH: 175 IU/L (ref 121–224)
LDL CALC: 102 mg/dL — AB (ref 0–99)
LYMPHS ABS: 2.5 10*3/uL (ref 0.7–3.1)
Lymphs: 43 %
MCH: 31.5 pg (ref 26.6–33.0)
MCHC: 34.1 g/dL (ref 31.5–35.7)
MCV: 93 fL (ref 79–97)
Monocytes Absolute: 0.3 10*3/uL (ref 0.1–0.9)
Monocytes: 5 %
NEUTROS ABS: 2.8 10*3/uL (ref 1.4–7.0)
Neutrophils: 49 %
POTASSIUM: 4.4 mmol/L (ref 3.5–5.2)
Phosphorus: 3.4 mg/dL (ref 2.5–4.5)
Platelets: 291 10*3/uL (ref 150–379)
RBC: 4.95 x10E6/uL (ref 4.14–5.80)
RDW: 12.9 % (ref 12.3–15.4)
SODIUM: 142 mmol/L (ref 134–144)
T3 Uptake Ratio: 26 % (ref 24–39)
T4, Total: 7.4 ug/dL (ref 4.5–12.0)
TSH: 1.63 u[IU]/mL (ref 0.450–4.500)
Total Protein: 7.5 g/dL (ref 6.0–8.5)
Triglycerides: 66 mg/dL (ref 0–149)
Uric Acid: 7.5 mg/dL (ref 3.7–8.6)
VLDL Cholesterol Cal: 13 mg/dL (ref 5–40)
WBC: 5.7 10*3/uL (ref 3.4–10.8)

## 2017-03-29 LAB — VITAMIN D 25 HYDROXY (VIT D DEFICIENCY, FRACTURES): Vit D, 25-Hydroxy: 32.9 ng/mL (ref 30.0–100.0)

## 2017-07-18 ENCOUNTER — Telehealth: Payer: Self-pay | Admitting: Allergy and Immunology

## 2017-07-18 NOTE — Telephone Encounter (Signed)
Pt called and wanted to know if he needed to come in for ov or could go for labs . [phone]

## 2017-07-21 NOTE — Telephone Encounter (Signed)
Informed patient that he needs a follow appointment and he stated that he will call back once he get his schedule.

## 2017-07-21 NOTE — Telephone Encounter (Signed)
Since it has been less than year since last time we saw him, we can order the alpha gal panel.  However, he will need to be seen for his epinephrine autoinjector to be refilled.

## 2017-07-21 NOTE — Telephone Encounter (Signed)
Lm, for pt to call us back

## 2017-07-29 ENCOUNTER — Encounter: Payer: Self-pay | Admitting: Allergy and Immunology

## 2017-07-29 ENCOUNTER — Ambulatory Visit (INDEPENDENT_AMBULATORY_CARE_PROVIDER_SITE_OTHER): Payer: BLUE CROSS/BLUE SHIELD | Admitting: Allergy and Immunology

## 2017-07-29 VITALS — BP 112/72 | HR 63 | Resp 18 | Wt 163.0 lb

## 2017-07-29 DIAGNOSIS — J3089 Other allergic rhinitis: Secondary | ICD-10-CM

## 2017-07-29 DIAGNOSIS — T7800XD Anaphylactic reaction due to unspecified food, subsequent encounter: Secondary | ICD-10-CM

## 2017-07-29 NOTE — Assessment & Plan Note (Signed)
Stable.  Continue appropriate allergen avoidance measures, cetirizine if needed, and Nasonex if needed.

## 2017-07-29 NOTE — Patient Instructions (Signed)
Alpha gal hypersensitivity  A lab order form has been provided to recheck serum specific IgE against galactose-alpha-1,3-galactose.  Deward will be called when the lab result has returned.  For now, continue careful avoidance of nonprimate mammalian meat and have access to epinephrine autoinjectors of accidental ingestion.  Seasonal and perennial allergic rhinitis Stable.  Continue appropriate allergen avoidance measures, cetirizine if needed, and Nasonex if needed.   When lab results have returned the patient will be called with further recommendations and follow up instructions.

## 2017-07-29 NOTE — Progress Notes (Signed)
    Follow-up Note  RE: [name] MRN: [id number] DOB: 09-08-1996 Date of Office Visit: 07/29/2017  Primary care provider: Merlyn Albert, MD Referring provider: Merlyn Albert, MD  History of present illness: [name] is a 21 y.o. male without for gal hypersensitivity and allergic rhinitis presenting today for follow up.  He was previously seen in this clinic for his initial evaluation in November 2017.  He reports that over this past year, while avoiding mammalian meat he has not had recurrence of symptoms.  He has access to epinephrine auto-injectors.  He reports that his nasal symptoms have been well-controlled with cetirizine as needed.  He discontinued montelukast in January with out an increase in symptoms.   Assessment and plan: Alpha gal hypersensitivity  A lab order form has been provided to recheck serum specific IgE against galactose-alpha-1,3-galactose.  [name] will be called when the lab result has returned.  For now, continue careful avoidance of nonprimate mammalian meat and have access to epinephrine autoinjectors of accidental ingestion.  Seasonal and perennial allergic rhinitis Stable.  Continue appropriate allergen avoidance measures, cetirizine if needed, and Nasonex if needed.   Diagnostics: Lab order form has been provided.    Physical examination: Blood pressure 112/72, pulse 63, resp. rate 18, weight 163 lb (73.9 kg), SpO2 97 %.  General: Alert, interactive, in no acute distress. HEENT: TMs pearly gray, turbinates mildly edematous without discharge, post-pharynx unremarkable. Neck: Supple without lymphadenopathy. Lungs: Clear to auscultation without wheezing, rhonchi or rales. CV: Normal S1, S2 without murmurs. Skin: Warm and dry, without lesions or rashes.  The following portions of the patient's history were reviewed and updated as appropriate: allergies, current medications, past family history, past medical history, past  social history, past surgical history and problem list.  Allergies as of 07/29/2017      Reactions   Augmentin [amoxicillin-pot Clavulanate]       Medication List       Accurate as of 07/29/17  3:30 PM. Always use your most recent med list.          EPINEPHrine 0.3 mg/0.3 mL Soaj injection Commonly known as:  AUVI-Q Use as directed for severe allergic reaction.   levocetirizine 5 MG tablet Commonly known as:  XYZAL Take 1 tablet (5 mg total) by mouth daily.   mometasone 50 MCG/ACT nasal spray Commonly known as:  NASONEX Place 2 sprays into the nose daily.   montelukast 10 MG tablet Commonly known as:  SINGULAIR Take 1 tablet (10 mg total) by mouth at bedtime.       Allergies  Allergen Reactions  . Augmentin [Amoxicillin-Pot Clavulanate]     I appreciate the opportunity to take part in Abdikadir's care. Please do not hesitate to contact me with questions.  Sincerely,   R. Jorene Guest, MD

## 2017-07-29 NOTE — Assessment & Plan Note (Signed)
   A lab order form has been provided to recheck serum specific IgE against galactose-alpha-1,3-galactose.  [name] will be called when the lab result has returned.  For now, continue careful avoidance of nonprimate mammalian meat and have access to epinephrine autoinjectors of accidental ingestion.

## 2017-08-01 LAB — ALPHA-GAL PANEL
Alpha Gal IgE*: 0.11 kU/L (ref <0.35)
Beef (Bos spp) IgE: <0.1 kU/L (ref <0.35)
Class Interpretation: 0
Class Interpretation: 0
Class Interpretation: 0
Lamb/Mutton (Ovis spp) IgE: <0.1 kU/L (ref <0.35)
Pork (Sus spp) IgE: <0.1 kU/L (ref <0.35)

## 2017-08-05 ENCOUNTER — Telehealth: Payer: Self-pay | Admitting: Allergy and Immunology

## 2017-08-05 NOTE — Telephone Encounter (Signed)
Patient would like test results.

## 2017-08-05 NOTE — Telephone Encounter (Signed)
Informed pt we still do not have results and will be calling once we have them

## 2019-01-25 ENCOUNTER — Ambulatory Visit (INDEPENDENT_AMBULATORY_CARE_PROVIDER_SITE_OTHER): Payer: BLUE CROSS/BLUE SHIELD | Admitting: Family Medicine

## 2019-01-25 ENCOUNTER — Other Ambulatory Visit: Payer: Self-pay

## 2019-01-25 DIAGNOSIS — Z20828 Contact with and (suspected) exposure to other viral communicable diseases: Secondary | ICD-10-CM

## 2019-01-25 DIAGNOSIS — Z20822 Contact with and (suspected) exposure to covid-19: Secondary | ICD-10-CM

## 2019-01-25 NOTE — Progress Notes (Signed)
   Subjective:    Patient ID: [name], male    DOB: 07-08-1996, 23 y.o.   MRN: [id number] Video plus audio HPIpt came in contact with a suspected covid 19 pt  over 14 days ago and the test did come back negative. Pt does not have any symtoms. He needs a note clearing him so his wife can return to work.   Virtual Visit via Telephone Note  I connected with [name] on 01/25/19 at  2:00 PM EDT by telephone and verified that I am speaking with the correct person using two identifiers.   I discussed the limitations, risks, security and privacy concerns of performing an evaluation and management service by telephone and the availability of in person appointments. I also discussed with the patient that there may be a patient responsible charge related to this service. The patient expressed understanding and agreed to proceed.   History of Present Illness:    Observations/Objective:   Assessment and Plan:   Follow Up Instructions:    I discussed the assessment and treatment plan with the patient. The patient was provided an opportunity to ask questions and all were answered. The patient agreed with the plan and demonstrated an understanding of the instructions.   The patient was advised to call back or seek an in-person evaluation if the symptoms worsen or if the condition fails to improve as anticipated.  I provided 15 minutes of non-face-to-face time during this encounter.      Review of Systems     Objective:   Physical Exam  Virtual visit      Assessment & Plan:  Substantial discussion held.  Complicated history.  Did have a potential exposure to someone else who turned out having coded.  Patient has been negative and self quarantining.  For 14 days.  Discussion held.  Will construct a letter supporting noninfectious notes  Greater than 50% of this 15 minute face to face visit was spent in counseling and discussion and coordination of care regarding  the above diagnosis/diagnosies

## 2019-05-24 ENCOUNTER — Other Ambulatory Visit: Payer: Self-pay

## 2019-05-24 ENCOUNTER — Ambulatory Visit (INDEPENDENT_AMBULATORY_CARE_PROVIDER_SITE_OTHER): Payer: Self-pay | Admitting: Family Medicine

## 2019-05-24 ENCOUNTER — Encounter: Payer: Self-pay | Admitting: Family Medicine

## 2019-05-24 DIAGNOSIS — M26623 Arthralgia of bilateral temporomandibular joint: Secondary | ICD-10-CM

## 2019-05-24 MED ORDER — ETODOLAC 400 MG PO TABS: ORAL_TABLET | ORAL | 1 refills | Status: DC

## 2019-05-24 MED ORDER — CYCLOBENZAPRINE HCL 10 MG PO TABS: ORAL_TABLET | ORAL | 2 refills | Status: DC

## 2019-05-24 NOTE — Progress Notes (Signed)
   Subjective:    Patient ID: [name], male    DOB: May 21, 1996, 23 y.o.   MRN: [id number] Audio plus video HPI Past month and a half had issues with jaw. Jaw pain, swelling, unable to talk at times. Pt has taken Ibuprofen for issue and that does help but wears off. Both sides are affected. Pt states it feels like he has been clenching his teeth all night.   Virtual Visit via Video Note  I connected with [name] on 05/24/19 at 10:00 AM EDT by a video enabled telemedicine application and verified that I am speaking with the correct person using two identifiers.  Location: Patient: home Provider: office   I discussed the limitations of evaluation and management by telemedicine and the availability of in person appointments. The patient expressed understanding and agreed to proceed.  History of Present Illness:    Observations/Objective:   Assessment and Plan:   Follow Up Instructions:    I discussed the assessment and treatment plan with the patient. The patient was provided an opportunity to ask questions and all were answered. The patient agreed with the plan and demonstrated an understanding of the instructions.   The patient was advised to call back or seek an in-person evaluation if the symptoms worsen or if the condition fails to improve as anticipated.  I provided 18 minutes of non-face-to-face time during this encounter. Bilateral jaw pain.  Patient thinks he may clench his teeth or grinds his teeth at nighttime.  Due to see dentist soon.  Over-the-counter Advil not doing much.  Was uncertain about wearing a over-the-counter mouthguard.  Progressive over the past month.  Reports some increased stress feels sleep hygiene overall is decent  Vicente Males, LPN    Review of Systems No headache, no major weight loss or weight gain, no chest pain no back pain abdominal pain no change in bowel habits complete ROS otherwise negative     Objective:   Physical Exam  Virtual      Assessment & Plan:  Impression TMJ progressive discussed avoid gum chewing nighttime Flexeril.  Prescription anti-inflammatory nightly.  Follow-up with dentist have been checked for bruxism signs.  Consider mouthguard

## 2019-07-22 DIAGNOSIS — R6884 Jaw pain: Secondary | ICD-10-CM | POA: Diagnosis not present

## 2019-07-22 DIAGNOSIS — R220 Localized swelling, mass and lump, head: Secondary | ICD-10-CM | POA: Diagnosis not present

## 2019-07-26 ENCOUNTER — Telehealth: Payer: Self-pay | Admitting: Family Medicine

## 2019-07-26 NOTE — Telephone Encounter (Signed)
Patient would like some suggestion for a good dentist in [place] .That will carry his insurance [employer]

## 2019-07-26 NOTE — Telephone Encounter (Signed)
Contacted patient and informed him that he could call the number on back of insurance card to see which dentist takes his insurance. Gave patient some dentist name in [place] and [place] that I knew of. Pt verbalized understanding

## 2019-11-17 ENCOUNTER — Encounter: Payer: Self-pay | Admitting: Family Medicine

## 2019-11-18 ENCOUNTER — Encounter: Payer: Self-pay | Admitting: Family Medicine

## 2020-01-11 ENCOUNTER — Ambulatory Visit (INDEPENDENT_AMBULATORY_CARE_PROVIDER_SITE_OTHER): Payer: BC Managed Care – PPO | Admitting: Family Medicine

## 2020-01-11 ENCOUNTER — Other Ambulatory Visit: Payer: Self-pay

## 2020-01-11 DIAGNOSIS — M26623 Arthralgia of bilateral temporomandibular joint: Secondary | ICD-10-CM

## 2020-01-11 MED ORDER — DULOXETINE HCL 20 MG PO CPEP: 20.0000 mg | ORAL_CAPSULE | Freq: Every day | ORAL | 11 refills | Status: DC

## 2020-01-11 NOTE — Progress Notes (Signed)
   Subjective:  Audio only  Patient ID: [name], male    DOB: 02/04/1996, 24 y.o.   MRN: [id number]  HPI Pt would like PCP to take over refills on Cymbalta 20 mg. Pt states he received Cymbalta from [facility].   Virtual Visit via Telephone Note  I connected with [name] on 01/11/20 at  3:00 PM EDT by telephone and verified that I am speaking with the correct person using two identifiers.  Location: Patient: home Provider: office   I discussed the limitations, risks, security and privacy concerns of performing an evaluation and management service by telephone and the availability of in person appointments. I also discussed with the patient that there may be a patient responsible charge related to this service. The patient expressed understanding and agreed to proceed.   History of Present Illness:    Observations/Objective:   Assessment and Plan:   Follow Up Instructions:    I discussed the assessment and treatment plan with the patient. The patient was provided an opportunity to ask questions and all were answered. The patient agreed with the plan and demonstrated an understanding of the instructions.   The patient was advised to call back or seek an in-person evaluation if the symptoms worsen or if the condition fails to improve as anticipated.  I provided 20 minutes of non-face-to-face time during this encounter.   Patient has had a fairly severe TMJ.  Bilateral.  Will attempt to oral surgeon crafted device.  This would have cost literally thousands of dollars.  Patient saw a physician who recommended low-dose Cymbalta.  Has been on 20 mg and handling very well   Review of Systems No headache, no major weight loss or weight gain, no chest pain no back pain abdominal pain no change in bowel habits complete ROS otherwise negative     Objective:   Physical Exam Virtual       Assessment & Plan:  Impression TMJ with  neuropathic element.  Much improved on Cymbalta.  Patient to maintain.  1 years was given

## 2020-08-24 ENCOUNTER — Other Ambulatory Visit: Payer: Self-pay

## 2020-08-24 ENCOUNTER — Ambulatory Visit: Payer: BC Managed Care – PPO | Admitting: Family Medicine

## 2020-08-24 ENCOUNTER — Encounter: Payer: Self-pay | Admitting: Family Medicine

## 2020-08-24 VITALS — BP 142/104 | HR 71 | Temp 97.8°F | Ht 67.0 in | Wt 196.6 lb

## 2020-08-24 DIAGNOSIS — F4329 Adjustment disorder with other symptoms: Secondary | ICD-10-CM

## 2020-08-24 DIAGNOSIS — R0609 Other forms of dyspnea: Secondary | ICD-10-CM

## 2020-08-24 DIAGNOSIS — U099 Post covid-19 condition, unspecified: Secondary | ICD-10-CM | POA: Diagnosis not present

## 2020-08-24 DIAGNOSIS — S0300XD Dislocation of jaw, unspecified side, subsequent encounter: Secondary | ICD-10-CM

## 2020-08-24 DIAGNOSIS — F32A Depression, unspecified: Secondary | ICD-10-CM | POA: Diagnosis not present

## 2020-08-24 MED ORDER — DULOXETINE HCL 30 MG PO CPEP: 30.0000 mg | ORAL_CAPSULE | Freq: Every day | ORAL | 1 refills | Status: DC

## 2020-08-24 MED ORDER — ALBUTEROL SULFATE HFA 108 (90 BASE) MCG/ACT IN AERS
2.0000 | INHALATION_SPRAY | Freq: Four times a day (QID) | RESPIRATORY_TRACT | 0 refills | Status: DC | PRN
Start: 2020-08-24 — End: 2020-09-21

## 2020-08-24 NOTE — Progress Notes (Signed)
Patient ID: [name], male    DOB: 10/01/1996, 24 y.o.   MRN: [id number]   Chief Complaint  Patient presents with  . Depression  . Follow-up    Patient reports having covid August-September, all symptoms were resolved for about 1 month and now patient has started experiencing SOB and chest tightness daily.   Subjective:    HPI Pt have f/u with tmj and getting tightness and hurting and went to health dept. The NP there thought was stress related and prescribed cymbalta. And it improved. Last 3-4 months, back to having some tmj again.  Back to having more stress and anxiety.  But feeling things should have improved  Trying to buy a house and wife got pregnant and trying to model a house.  So baby came and house worked out.   Wife seeing therapist in another city.  And occ seeing them together.  covid - aug/sept.  Out of work 10 days. Felt better and returned to work.  Had sob, chest pain, fatigue.  And most of all that resolved.   Now noticing On daily basis, chest tightness and sob with exertion. Worse at work. No h/o asthma. 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 No chest pain, palpitations.   Pt stating unable to exercise on his rower like he used to or running with his child short distance is feeling out of breath more easily.  Medical History [name] has a past medical history of Angioedema (09/02/2016), Depression, Oppositional defiant disorder, Seasonal allergies, Seizures (HCC), Urticaria, and Wears glasses.   Outpatient Encounter Medications as of 08/24/2020  Medication Sig  . albuterol (VENTOLIN HFA) 108 (90 Base) MCG/ACT inhaler Inhale 2 puffs into the lungs every 6 (six) hours as needed for wheezing or shortness of breath.  . DULoxetine (CYMBALTA) 30 MG capsule Take 1 capsule (30 mg total) by mouth daily.  . [DISCONTINUED] DULoxetine (CYMBALTA) 20 MG capsule Take 1 capsule (20 mg total) by mouth daily.  . [DISCONTINUED] DULoxetine  (CYMBALTA) 30 MG capsule Take 1 capsule (30 mg total) by mouth daily.   No facility-administered encounter medications on file as of 08/24/2020.     Review of Systems  Constitutional: Negative for chills and fever.  HENT: Negative for congestion, rhinorrhea and sore throat.   Respiratory: Positive for chest tightness and shortness of breath. Negative for cough and wheezing.   Cardiovascular: Negative for chest pain and leg swelling.  Gastrointestinal: Negative for abdominal pain, diarrhea, nausea and vomiting.  Genitourinary: Negative for dysuria and frequency.  Skin: Negative for rash.  Neurological: Negative for dizziness, weakness and headaches.  Psychiatric/Behavioral: Negative for agitation, confusion, decreased concentration, dysphoric mood and sleep disturbance. The patient is nervous/anxious.      Vitals BP (!) 142/104   Pulse 71   Temp 97.8 F (36.6 C)   Ht 5\' 7"  (1.702 m)   Wt 196 lb 9.6 oz (89.2 kg)   SpO2 97%   BMI 30.79 kg/m   Objective:   Physical Exam Vitals and nursing note reviewed.  Constitutional:      General: He is not in acute distress.    Appearance: Normal appearance. He is not ill-appearing.  HENT:     Head: Normocephalic.     Nose: Nose normal. No congestion.     Mouth/Throat:     Mouth: Mucous membranes are moist.     Pharynx: No oropharyngeal exudate.  Eyes:     Extraocular Movements: Extraocular movements intact.  Conjunctiva/sclera: Conjunctivae normal.     Pupils: Pupils are equal, round, and reactive to light.  Cardiovascular:     Rate and Rhythm: Normal rate and regular rhythm.     Pulses: Normal pulses.     Heart sounds: Normal heart sounds. No murmur heard.   Pulmonary:     Effort: Pulmonary effort is normal. No respiratory distress.     Breath sounds: Normal breath sounds. No wheezing, rhonchi or rales.  Musculoskeletal:        General: Normal range of motion.     Right lower leg: No edema.     Left lower leg: No edema.    Skin:    General: Skin is warm and dry.     Findings: No rash.  Neurological:     General: No focal deficit present.     Mental Status: He is alert and oriented to person, place, and time.     Cranial Nerves: No cranial nerve deficit.  Psychiatric:        Behavior: Behavior normal.        Thought Content: Thought content normal.        Judgment: Judgment normal.     Comments: anxious      Assessment and Plan   1. Depression, unspecified depression type - DULoxetine (CYMBALTA) 30 MG capsule; Take 1 capsule (30 mg total) by mouth daily.  Dispense: 30 capsule; Refill: 1  2. Dislocation of temporomandibular joint, subsequent encounter - DULoxetine (CYMBALTA) 30 MG capsule; Take 1 capsule (30 mg total) by mouth daily.  Dispense: 30 capsule; Refill: 1  3. Stress and adjustment reaction - DULoxetine (CYMBALTA) 30 MG capsule; Take 1 capsule (30 mg total) by mouth daily.  Dispense: 30 capsule; Refill: 1  4. Post-COVID chronic dyspnea - albuterol (VENTOLIN HFA) 108 (90 Base) MCG/ACT inhaler; Inhale 2 puffs into the lungs every 6 (six) hours as needed for wheezing or shortness of breath.  Dispense: 8 g; Refill: 0   Pt very anxious in room today.  Dyspnea with h/o covid 19- VS, intermittent symptoms.  recommending inhaler to use prn for sob, with exertion.  If not improving in next 1-2 months to call back. May need referral to pulm or post covid clinic. Pt stable at this time and only out about 2 months from his covid illness. Pt had mild cause of covid, per pt. Call if worsening symptoms.  For tmj and depression- recommended increasing cymbalta from 20mg  to 30mg . Increase stress and anxiety with home, new baby, and work.  Cont to monitor.  If not improving in next 3-4 wks pt to call back.  tmj- recommended seeing dentist for a mouth guard to help.  F/u 59mo or prn.

## 2020-08-24 NOTE — Patient Instructions (Signed)

## 2020-09-21 ENCOUNTER — Other Ambulatory Visit: Payer: Self-pay | Admitting: Family Medicine

## 2020-09-21 DIAGNOSIS — R0609 Other forms of dyspnea: Secondary | ICD-10-CM

## 2020-09-21 DIAGNOSIS — U099 Post covid-19 condition, unspecified: Secondary | ICD-10-CM

## 2020-10-21 ENCOUNTER — Other Ambulatory Visit: Payer: Self-pay | Admitting: Family Medicine

## 2020-10-21 DIAGNOSIS — F4329 Adjustment disorder with other symptoms: Secondary | ICD-10-CM

## 2020-10-21 DIAGNOSIS — S0300XD Dislocation of jaw, unspecified side, subsequent encounter: Secondary | ICD-10-CM

## 2020-10-21 DIAGNOSIS — F32A Depression, unspecified: Secondary | ICD-10-CM

## 2020-10-23 ENCOUNTER — Other Ambulatory Visit: Payer: Self-pay | Admitting: *Deleted

## 2020-10-23 DIAGNOSIS — F32A Depression, unspecified: Secondary | ICD-10-CM

## 2020-10-23 DIAGNOSIS — S0300XD Dislocation of jaw, unspecified side, subsequent encounter: Secondary | ICD-10-CM

## 2020-10-23 DIAGNOSIS — F4329 Adjustment disorder with other symptoms: Secondary | ICD-10-CM

## 2020-10-23 MED ORDER — DULOXETINE HCL 30 MG PO CPEP: 30.0000 mg | ORAL_CAPSULE | Freq: Every day | ORAL | 0 refills | Status: DC

## 2020-10-25 NOTE — Telephone Encounter (Signed)
Please contact patient to have him set up follow up appt. Thank you. 

## 2020-10-25 NOTE — Telephone Encounter (Signed)
Pt needs f/u appt for cymbalta refills. Thx. Dr. Ladona Ridgel

## 2020-11-22 ENCOUNTER — Ambulatory Visit: Payer: BC Managed Care – PPO | Admitting: Family Medicine

## 2020-11-22 ENCOUNTER — Encounter: Payer: Self-pay | Admitting: Family Medicine

## 2020-11-22 ENCOUNTER — Other Ambulatory Visit: Payer: Self-pay

## 2020-11-22 DIAGNOSIS — F4329 Adjustment disorder with other symptoms: Secondary | ICD-10-CM

## 2020-11-22 DIAGNOSIS — F32A Depression, unspecified: Secondary | ICD-10-CM

## 2020-11-22 DIAGNOSIS — S0300XD Dislocation of jaw, unspecified side, subsequent encounter: Secondary | ICD-10-CM

## 2020-11-22 DIAGNOSIS — M26609 Unspecified temporomandibular joint disorder, unspecified side: Secondary | ICD-10-CM

## 2020-11-22 MED ORDER — DULOXETINE HCL 30 MG PO CPEP: 30.0000 mg | ORAL_CAPSULE | Freq: Every day | ORAL | 1 refills | Status: DC

## 2020-11-22 NOTE — Progress Notes (Signed)
Patient ID: [name], male    DOB: [birth year], 25 y.o.   MRN: [id number]   Chief Complaint  Patient presents with  . Depression    Follow up   Subjective:   HPI  F/u depression/TMJ-  covid illness recently-improving. Has not had to use inhaler since christmas for post covid chest tightness-doing better  H/o stress/anxiety and TMJ pain- Feeling better on cymbalta.  It is helping with the TMJ.  And is working well and less stressed and anxious.  Came in nov was having some sob and chest tightness when had covid in 9/21. Around xmas was improved.  Not having to use inhaler much now. Able to exercise without issues.   Medical History Yaw has a past medical history of Angioedema (09/02/2016), Depression, Oppositional defiant disorder, Seasonal allergies, Seizures (HCC), Urticaria, and Wears glasses.   Outpatient Encounter Medications as of 11/22/2020  Medication Sig  . DULoxetine (CYMBALTA) 30 MG capsule Take 1 capsule (30 mg total) by mouth daily.  . VENTOLIN HFA 108 (90 Base) MCG/ACT inhaler INHALE 2 PUFFS BY MOUTH EVERY 6 HOURS AS NEEDED FOR WHEEZING AND FOR SHORTNESS OF BREATH  . [DISCONTINUED] DULoxetine (CYMBALTA) 30 MG capsule Take 1 capsule (30 mg total) by mouth daily.   No facility-administered encounter medications on file as of 11/22/2020.     Review of Systems  Constitutional: Negative for chills and fever.  HENT: Negative for congestion, rhinorrhea and sore throat.   Respiratory: Negative for cough, shortness of breath and wheezing.   Cardiovascular: Negative for chest pain and leg swelling.  Gastrointestinal: Negative for abdominal pain, diarrhea, nausea and vomiting.  Genitourinary: Negative for dysuria and frequency.  Skin: Negative for rash.  Neurological: Negative for dizziness, weakness and headaches.     Vitals BP 122/82   Pulse 80   Temp (!) 97 F (36.1 C) (Oral)   Ht 5\' 7"  (1.702 m)   Wt 196 lb 3.2 oz (89 kg)   SpO2 98%   BMI  30.73 kg/m   Objective:   Physical Exam Vitals and nursing note reviewed.  Constitutional:      General: He is not in acute distress.    Appearance: Normal appearance. He is not ill-appearing.  Cardiovascular:     Rate and Rhythm: Normal rate and regular rhythm.     Pulses: Normal pulses.     Heart sounds: Normal heart sounds. No murmur heard.   Pulmonary:     Effort: Pulmonary effort is normal. No respiratory distress.     Breath sounds: Normal breath sounds.  Musculoskeletal:        General: Normal range of motion.  Skin:    General: Skin is warm and dry.     Findings: No rash.  Neurological:     General: No focal deficit present.     Mental Status: He is alert and oriented to person, place, and time.  Psychiatric:        Mood and Affect: Mood normal.        Behavior: Behavior normal.        Thought Content: Thought content normal.        Judgment: Judgment normal.      Assessment and Plan   1. Depression, unspecified depression type - DULoxetine (CYMBALTA) 30 MG capsule; Take 1 capsule (30 mg total) by mouth daily.  Dispense: 90 capsule; Refill: 1  2. Dislocation of temporomandibular joint, subsequent encounter - DULoxetine (CYMBALTA) 30 MG capsule; Take 1 capsule (30 mg  total) by mouth daily.  Dispense: 90 capsule; Refill: 1  3. Stress and adjustment reaction - DULoxetine (CYMBALTA) 30 MG capsule; Take 1 capsule (30 mg total) by mouth daily.  Dispense: 90 capsule; Refill: 1    Pt doing well with cymbalta helping with stress and TMJ pain. Will cont meds.  F/u in 87mo or prn.

## 2020-12-10 DIAGNOSIS — M26609 Unspecified temporomandibular joint disorder, unspecified side: Secondary | ICD-10-CM | POA: Insufficient documentation

## 2021-01-10 DIAGNOSIS — H5203 Hypermetropia, bilateral: Secondary | ICD-10-CM | POA: Diagnosis not present

## 2021-05-03 DIAGNOSIS — M26623 Arthralgia of bilateral temporomandibular joint: Secondary | ICD-10-CM | POA: Diagnosis not present

## 2021-05-03 DIAGNOSIS — F419 Anxiety disorder, unspecified: Secondary | ICD-10-CM | POA: Diagnosis not present

## 2021-05-24 ENCOUNTER — Telehealth: Payer: Self-pay | Admitting: Family Medicine

## 2021-05-24 DIAGNOSIS — F4329 Adjustment disorder with other symptoms: Secondary | ICD-10-CM

## 2021-05-24 DIAGNOSIS — S0300XD Dislocation of jaw, unspecified side, subsequent encounter: Secondary | ICD-10-CM

## 2021-05-24 DIAGNOSIS — F32A Depression, unspecified: Secondary | ICD-10-CM

## 2021-05-25 NOTE — Telephone Encounter (Signed)
Needs phone visit for f/u on this medication for stress/TMJ. Thx. Dr. Ladona Ridgel

## 2021-06-13 NOTE — Telephone Encounter (Signed)
Left message to schedule appointment

## 2021-06-15 DIAGNOSIS — M26623 Arthralgia of bilateral temporomandibular joint: Secondary | ICD-10-CM | POA: Diagnosis not present

## 2021-06-15 DIAGNOSIS — F419 Anxiety disorder, unspecified: Secondary | ICD-10-CM | POA: Diagnosis not present

## 2021-06-15 DIAGNOSIS — F320 Major depressive disorder, single episode, mild: Secondary | ICD-10-CM | POA: Diagnosis not present

## 2021-06-28 NOTE — Telephone Encounter (Signed)
Left message 8/24/ no response

## 2021-07-24 DIAGNOSIS — M26623 Arthralgia of bilateral temporomandibular joint: Secondary | ICD-10-CM | POA: Diagnosis not present

## 2021-07-24 DIAGNOSIS — F419 Anxiety disorder, unspecified: Secondary | ICD-10-CM | POA: Diagnosis not present

## 2021-07-24 DIAGNOSIS — J309 Allergic rhinitis, unspecified: Secondary | ICD-10-CM | POA: Diagnosis not present

## 2021-07-24 DIAGNOSIS — F320 Major depressive disorder, single episode, mild: Secondary | ICD-10-CM | POA: Diagnosis not present

## 2021-08-29 DIAGNOSIS — M26623 Arthralgia of bilateral temporomandibular joint: Secondary | ICD-10-CM | POA: Diagnosis not present

## 2021-08-29 DIAGNOSIS — F320 Major depressive disorder, single episode, mild: Secondary | ICD-10-CM | POA: Diagnosis not present

## 2021-08-29 DIAGNOSIS — F419 Anxiety disorder, unspecified: Secondary | ICD-10-CM | POA: Diagnosis not present

## 2021-08-29 DIAGNOSIS — R41840 Attention and concentration deficit: Secondary | ICD-10-CM | POA: Diagnosis not present

## 2022-03-19 ENCOUNTER — Other Ambulatory Visit: Payer: Self-pay

## 2022-03-22 ENCOUNTER — Other Ambulatory Visit: Payer: Self-pay

## 2022-03-22 MED ORDER — BUPROPION HCL ER (XL) 150 MG PO TB24
ORAL_TABLET | ORAL | 1 refills | Status: DC
  Filled 2022-03-22: qty 30, 30d supply, fill #0
  Filled 2022-04-29: qty 30, 30d supply, fill #1

## 2022-03-22 MED ORDER — BUSPIRONE HCL 5 MG PO TABS
ORAL_TABLET | ORAL | 11 refills | Status: DC
  Filled 2022-03-22: qty 60, 30d supply, fill #0
  Filled 2022-04-29: qty 60, 30d supply, fill #1
  Filled 2022-05-27: qty 60, 30d supply, fill #2
  Filled 2022-06-28: qty 60, 30d supply, fill #3
  Filled 2022-07-29: qty 60, 30d supply, fill #4
  Filled 2022-08-27: qty 60, 30d supply, fill #5
  Filled 2022-09-29: qty 60, 30d supply, fill #6

## 2022-04-29 ENCOUNTER — Other Ambulatory Visit: Payer: Self-pay

## 2022-04-29 MED ORDER — DULOXETINE HCL 30 MG PO CPEP
ORAL_CAPSULE | ORAL | 3 refills | Status: DC
  Filled 2022-04-29: qty 90, 45d supply, fill #0
  Filled 2022-05-27: qty 90, 45d supply, fill #1
  Filled 2022-07-29: qty 90, 45d supply, fill #2
  Filled 2022-09-07: qty 90, 45d supply, fill #3

## 2022-04-30 ENCOUNTER — Other Ambulatory Visit: Payer: Self-pay

## 2022-05-27 ENCOUNTER — Other Ambulatory Visit: Payer: Self-pay

## 2022-05-27 MED ORDER — BUPROPION HCL ER (XL) 150 MG PO TB24
150.0000 mg | ORAL_TABLET | Freq: Every day | ORAL | 1 refills | Status: DC
  Filled 2022-05-27: qty 30, 30d supply, fill #0
  Filled 2022-06-28: qty 30, 30d supply, fill #1

## 2022-05-31 ENCOUNTER — Ambulatory Visit: Payer: Self-pay | Admitting: Physician Assistant

## 2022-06-04 ENCOUNTER — Other Ambulatory Visit: Payer: Self-pay

## 2022-06-28 ENCOUNTER — Other Ambulatory Visit: Payer: Self-pay

## 2022-07-29 ENCOUNTER — Other Ambulatory Visit: Payer: Self-pay

## 2022-07-29 MED ORDER — BUPROPION HCL ER (XL) 150 MG PO TB24
150.0000 mg | ORAL_TABLET | Freq: Every day | ORAL | 1 refills | Status: DC
  Filled 2022-07-29: qty 90, 90d supply, fill #0

## 2022-07-31 ENCOUNTER — Other Ambulatory Visit: Payer: Self-pay

## 2022-07-31 MED ORDER — AMOXICILLIN 875 MG PO TABS
875.0000 mg | ORAL_TABLET | Freq: Two times a day (BID) | ORAL | 1 refills | Status: DC
Start: 2022-07-31 — End: 2022-08-19
  Filled 2022-07-31: qty 20, 10d supply, fill #0

## 2022-08-19 ENCOUNTER — Ambulatory Visit (INDEPENDENT_AMBULATORY_CARE_PROVIDER_SITE_OTHER): Payer: No Typology Code available for payment source | Admitting: Physician Assistant

## 2022-08-19 ENCOUNTER — Encounter: Payer: Self-pay | Admitting: Physician Assistant

## 2022-08-19 VITALS — BP 123/78 | HR 98 | Temp 98.0°F | Resp 12 | Ht 67.0 in | Wt 198.4 lb

## 2022-08-19 DIAGNOSIS — E669 Obesity, unspecified: Secondary | ICD-10-CM

## 2022-08-19 DIAGNOSIS — F32A Depression, unspecified: Secondary | ICD-10-CM

## 2022-08-19 DIAGNOSIS — J3089 Other allergic rhinitis: Secondary | ICD-10-CM

## 2022-08-19 DIAGNOSIS — L500 Allergic urticaria: Secondary | ICD-10-CM | POA: Diagnosis not present

## 2022-08-19 DIAGNOSIS — Z23 Encounter for immunization: Secondary | ICD-10-CM

## 2022-08-19 DIAGNOSIS — T7800XA Anaphylactic reaction due to unspecified food, initial encounter: Secondary | ICD-10-CM

## 2022-08-19 DIAGNOSIS — M26609 Unspecified temporomandibular joint disorder, unspecified side: Secondary | ICD-10-CM | POA: Diagnosis not present

## 2022-08-19 NOTE — Progress Notes (Signed)
I,[name],acting as a Neurosurgeon for [employer], Harmon.,have documented all relevant documentation on the behalf of [name],as directed by  [employer], Harmon while in the presence of [employer], Harmon.     New patient visit   Patient: [name]   DOB: 04-11-1996   26 y.o. Male  MRN: [id number] Visit Date: 08/19/2022  Today's healthcare provider: Debera Harmon, Harmon   Chief Complaint  Patient presents with  . Establish Care   Subjective    [name] is a 26 y.o. male who presents today as a new patient to establish care.  HPI   [name] prefers to establish a care with in-network PCP in [place] vs [place] as he just recently found a job at [employer]. In 2017: for a year pt had been symptoms of alpha gal hypersensitivity Has hx of seasonal allergies related to pollen/fall and spring TMJ was managed by his old PCP. Was well-managed on Cymbalta. Endorses having symptoms of [facility] pulpitis recently and was treated by dental  with antibiotics. Notices that symptoms of TMJ subsided Will plan to see his dentis in 2-3 mo  Anxiety Currently pt take three medications: Cymbalta, Buspar and Wellbutrin Pt believes that triple therapy has been helpful in controlling his anxiety. Believes that he does not have depression  Has a hx of seizure/oct of 2011   Past Medical History:  Diagnosis Date  . Angioedema 09/02/2016  . Depression   . Oppositional defiant disorder   . Seasonal allergies   . Seizures (HCC)    sinle seizure oct/2011  . Urticaria   . Wears glasses    Past Surgical History:  Procedure Laterality Date  . sugery for arm fracture age 87     Family Status  Relation Name Status  . Father  (Not Specified)  . PGM  (Not Specified)  . Mother  (Not Specified)  . Sister  (Not Specified)   Family History  Problem Relation Age of Onset  . Alcohol abuse Father   . Depression Paternal Grandmother   .  Allergies Mother   . Allergies Sister    Social History   Socioeconomic History  . Marital status: Single    Spouse name: Not on file  . Number of children: Not on file  . Years of education: Not on file  . Highest education level: Not on file  Occupational History  . Not on file  Tobacco Use  . Smoking status: Some Days    Types: Cigars  . Smokeless tobacco: Former    Types: Chew    Quit date: 02/20/2019  . Tobacco comments:    Occasional cigar every 6 months  Vaping Use  . Vaping Use: Former  Substance and Sexual Activity  . Alcohol use: Yes    Alcohol/week: 0.0 standard drinks of alcohol    Comment: occas  . Drug use: No  . Sexual activity: Never  Other Topics Concern  . Not on file  Social History Narrative  . Not on file   Social Determinants of Health   Financial Resource Strain: Not on file  Food Insecurity: Not on file  Transportation Needs: Not on file  Physical Activity: Not on file  Stress: Not on file  Social Connections: Not on file   Outpatient Medications Prior to Visit  Medication Sig  . buPROPion (WELLBUTRIN XL) 150 MG 24 hr tablet Take one tablet by mouth daily  . busPIRone (BUSPAR) 5 MG tablet Take  one tablet by mouth twice a day  . DULoxetine (CYMBALTA) 30 MG capsule Take 2 capsules (60 mg total) by mouth once daily  . [DISCONTINUED] amoxicillin (AMOXIL) 875 MG tablet Take 1 tablet (875 mg total) by mouth every 12 (twelve) hours.  . [DISCONTINUED] DULoxetine (CYMBALTA) 30 MG capsule Take 1 capsule by mouth once daily  . [DISCONTINUED] VENTOLIN HFA 108 (90 Base) MCG/ACT inhaler INHALE 2 PUFFS BY MOUTH EVERY 6 HOURS AS NEEDED FOR WHEEZING AND FOR SHORTNESS OF BREATH (Patient not taking: Reported on 08/19/2022)   No facility-administered medications prior to visit.   Allergies  Allergen Reactions  . Augmentin [Amoxicillin-Pot Clavulanate]     Immunization History  Administered Date(s) Administered  . Hepatitis A 03/04/2013  . Hepatitis A,  Ped/Adol-2 Dose 09/21/2013  . Influenza,inj,Quad PF,6+ Mos 07/29/2022  . Meningococcal Conjugate 06/30/2007, 03/04/2013  . Moderna Sars-Covid-2 Vaccination 10/20/2019, 11/18/2019, 11/10/2020  . Tdap 06/30/2007    Health Maintenance  Topic Date Due  . HIV Screening  Never done  . Hepatitis C Screening  Never done  . TETANUS/TDAP  06/29/2017  . COVID-19 Vaccine (4 - Moderna series) 01/05/2021  . INFLUENZA VACCINE  Completed  . HPV VACCINES  Aged Out    Patient Care Team: [name] as PCP - General (Physician Assistant)  Review of Systems  Constitutional:  Negative for appetite change, chills, fatigue and fever.  HENT:  Negative for congestion, ear pain, hearing loss, nosebleeds and trouble swallowing.   Eyes:  Negative for pain and visual disturbance.  Respiratory:  Negative for cough, chest tightness and shortness of breath.   Cardiovascular:  Negative for chest pain, palpitations and leg swelling.  Gastrointestinal:  Negative for abdominal pain, blood in stool, constipation, diarrhea, nausea and vomiting.  Endocrine: Negative for polydipsia, polyphagia and polyuria.  Genitourinary:  Negative for dysuria and flank pain.  Musculoskeletal:  Negative for arthralgias, back pain, joint swelling, myalgias and neck stiffness.  Skin:  Negative for color change, rash and wound.  Neurological:  Negative for dizziness, tremors, seizures, speech difficulty, weakness, light-headedness and headaches.  Psychiatric/Behavioral:  Negative for behavioral problems, confusion, decreased concentration, dysphoric mood and sleep disturbance. The patient is not nervous/anxious.   All other systems reviewed and are negative.   {Labs  Heme  Chem  Endocrine  Serology  Results Review (optional):23779}   Objective    BP 123/78 (BP Location: Right Arm, Patient Position: Sitting, Cuff Size: Large)   Pulse 98   Temp 98 F (36.7 C) (Oral)   Resp 12   Ht 5\' 7"  (1.702 m)   Wt 198 lb 6.4 oz  (90 kg)   SpO2 100% Comment: room air  BMI 31.07 kg/m  {Show previous vital signs (optional):23777}  Physical Exam Vitals reviewed.  Constitutional:      General: He is not in acute distress.    Appearance: Normal appearance. He is not diaphoretic.  HENT:     Head: Normocephalic and atraumatic.  Eyes:     General: No scleral icterus.    Conjunctiva/sclera: Conjunctivae normal.     Comments: Wears lenses  Cardiovascular:     Rate and Rhythm: Normal rate and regular rhythm.     Pulses: Normal pulses.     Heart sounds: Normal heart sounds. No murmur heard. Pulmonary:     Effort: Pulmonary effort is normal. No respiratory distress.     Breath sounds: Normal breath sounds. No wheezing or rhonchi.  Musculoskeletal:     Cervical back: Neck supple.  Right lower leg: No edema.     Left lower leg: No edema.  Lymphadenopathy:     Cervical: No cervical adenopathy.  Skin:    General: Skin is warm and dry.     Findings: No rash.  Neurological:     Mental Status: He is alert and oriented to person, place, and time. Mental status is at baseline.  Psychiatric:        Mood and Affect: Mood normal.        Behavior: Behavior normal.    Depression Screen    11/22/2020    1:23 PM 01/11/2020    1:43 PM  PHQ 2/9 Scores  PHQ - 2 Score 0 0  PHQ- 9 Score 3    No results found for any visits on 08/19/22.  Assessment & Plan     1. Depression, unspecified depression type On triple therapy but it could be concerning for drug interruction - Ambulatory referral to Psychiatry - Ambulatory referral to Psychology  2. TMJ (temporomandibular joint disorder) Was managed by "old" PCP Resolved?    4. Seasonal and perennial allergic rhinitis   Alpha gal hypersensitivity ***  6. Need for tetanus, diphtheria, and acellular pertussis (Tdap) vaccine in patient of adolescent age or older *** - Administer Tetanus-diphtheria-acellular pertussis (Tdap) vaccine   FU after seein    The patient  was advised to call back or seek an in-person evaluation if the symptoms worsen or if the condition fails to improve as anticipated.  I discussed the assessment and treatment plan with the patient. The patient was provided an opportunity to ask questions and all were answered. The patient agreed with the plan and demonstrated an understanding of the instructions.  The entirety of the information documented in the History of Present Illness, Review of Systems and Physical Exam were personally obtained by me. Portions of this information were initially documented by the CMA and reviewed by me for thoroughness and accuracy.  Portions of this note were created using dictation software and may contain typographical errors.     Mardene Speak, Harmon  [facility] [phone] (phone) [phone] (fax)  [place]

## 2022-08-27 ENCOUNTER — Other Ambulatory Visit: Payer: Self-pay

## 2022-09-09 ENCOUNTER — Other Ambulatory Visit: Payer: Self-pay

## 2022-09-17 ENCOUNTER — Ambulatory Visit (INDEPENDENT_AMBULATORY_CARE_PROVIDER_SITE_OTHER): Payer: No Typology Code available for payment source | Admitting: Psychiatry

## 2022-09-17 ENCOUNTER — Encounter (HOSPITAL_COMMUNITY): Payer: Self-pay | Admitting: Psychiatry

## 2022-09-17 ENCOUNTER — Other Ambulatory Visit: Payer: Self-pay

## 2022-09-17 DIAGNOSIS — M26609 Unspecified temporomandibular joint disorder, unspecified side: Secondary | ICD-10-CM | POA: Diagnosis not present

## 2022-09-17 DIAGNOSIS — F1590 Other stimulant use, unspecified, uncomplicated: Secondary | ICD-10-CM | POA: Diagnosis not present

## 2022-09-17 DIAGNOSIS — F325 Major depressive disorder, single episode, in full remission: Secondary | ICD-10-CM | POA: Diagnosis not present

## 2022-09-17 DIAGNOSIS — F411 Generalized anxiety disorder: Secondary | ICD-10-CM | POA: Diagnosis not present

## 2022-09-17 HISTORY — DX: Other stimulant use, unspecified, uncomplicated: F15.90

## 2022-09-17 MED ORDER — DULOXETINE HCL 30 MG PO CPEP
ORAL_CAPSULE | ORAL | 1 refills | Status: DC
  Filled 2022-09-17 – 2022-10-23 (×2): qty 60, 30d supply, fill #0

## 2022-09-17 NOTE — Progress Notes (Signed)
Psychiatric Initial Adult Assessment  Patient Identification: [name] MRN:  [id number] Date of Evaluation:  09/17/2022 Referral Source: PCP  Assessment:  [name] is a 26 y.o. male with a history of generalized anxiety disorder, MDD in remission with one lifetime aborted suicide attempt at age 22-15, TMJ, caffeine overuse, history of tobacco use disorder, history of cannabis use disorder in sustained remission, and childhood ODD who presents to [facility] via video conferencing for initial evaluation of anxiety.  Patient reports improving jaw pain since starting cymbalta but ongoing anxious thoughts consistent with generalized anxiety disorder. He did have one lifetime aborted suicide attempt in adolescence and hasn't had SI since in discussion with patient and depression is still in remission. He is using an excessive amount of caffeine daily at 32-40oz and encouraged him to cut back as this may be a perpetuating reason for his anxiousness. He also recently quit smoking in October 2023. Remote use of cannabis last in 2021. He is not currently seeing psychotherapy and with relatively low amounts of daily anxiety it is reasonable to trial just changes above before establishing with therapy.  For safety, his acute risk factors for suicide are: none. His chronic risk factors are: chronic mental illness, history of aborted suicide attempt, past substance use. His protective factors are: supportive family, employment, minor children living in the home, beloved pets, actively seeking and engaging with mental/physical healthcare, contracting for safety, no SI. While future events cannot be fully predicted he is not currently meeting IVC criteria and can continue as an outpatient.   Plan:  # Generalized anxiety disorder  Caffeine over use Past medication trials:  Status of problem: new to provider Interventions: -- continue cymbalta 60mg  daily -- continue  buspar 5mg  bid for now, plan to discontinue in the future -- discontinue wellbutrin xl 150mg  daily -- cut back on caffeine use  # TMJ Past medication trials:  Status of problem: new to provider Interventions: -- discontinue wellbutrin -- cut back on caffeine -- ask dentist about night guard fitting  # History of tobacco use disorder in early remission Past medication trials:  Status of problem: new to provider Interventions: -- continue to encourage abstinence  # History of cannabis use disorder in sustained remission Past medication trials:  Status of problem: new to provider Interventions: -- continue to encourage abstinence  # Depression, in remission  History of 1 lifetime suicide attempt Past medication trials:  Status of problem: new to provider Interventions: -- continue to monitor for recurrence  Patient was given contact information for behavioral health clinic and was instructed to call 911 for emergencies.   Subjective:  Chief Complaint: No chief complaint on file.   History of Present Illness:  Looking for medication management. Sees in [facility]. Treated for anxiety and TMJ.   Lives with wife and 36 year old son, cats and everyone gets along. Enjoys playing video games, wood working and still enjoys. Sleeps well when he can. Between 6-8hrs per night. Mostly has enough energy. Drinks 36-40oz per day of coffee, last around lunch time. Appetite is ok, sometimes will skip lunch but mostly 3 meals per day. No bingeing outside of once every 1-2 months. No eating to point of illness. No purging or restricting. Concentration mostly ok; works at [employer] in [place]. No depressed mood. No guilt feelings. Fidgety. No SI at present. Was more of an issue in his late teens and early 77s.   Chronic worry  across multiple domains, previously had more racing thoughts and muscle tension than now. No panic attacks. No issues with crowds or public speaking.  No period of sleeplessness since teenager; longest was 2 nights. No hallucinations. No paranoia.   Alcohol is none currently; used to be social drinking or 1 beer at dinner. At times would be 6-7 socially. Quit smoking cigars once daily about October 2023. Quit vaping as well. No drugs at present, has used marijuana edibles and smoked joints infrequently previously. Last use over 2 years ago. 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   Past Psychiatric History:  Diagnoses: ODD, MDD, TMJ, anxiety, ADHD Medication trials: cymbalta, wellbutrin, buspar. Zoloft, prozac, celexa.  Previous psychiatrist/therapist: yes Hospitalizations: yes as below Suicide attempts: mother stopped him at age 61-15 with plan to hang himself in woods behind house SIB: cut in late teens and early 56s on thighs, upper arms, wrist Hx of violence towards others: none Current access to guns: rifles, handgun stored in a gun safe Hx of abuse: none  Previous Psychotropic Medications: Yes   Substance Abuse History in the last 12 months:  No.  Past Medical History:  Past Medical History:  Diagnosis Date   Angioedema 09/02/2016   Depression    History of seizure    Oppositional defiant disorder    Seasonal allergies    Seizures (HCC)    sinle seizure oct/2011   Urticaria    Wears glasses     Past Surgical History:  Procedure Laterality Date   sugery for arm fracture age 61      Family Psychiatric History: maternal great grandmother with depression  Family History:  Family History  Problem Relation Age of Onset   Allergies Mother    Alcohol abuse Father    Heart attack Father    Allergies Sister    Breast cancer Maternal Grandmother    Depression Paternal Grandmother     Social History:   Social History   Socioeconomic History   Marital status: Married    Spouse name: Not on file   Number of children: Not on file   Years of education: Not on file   Highest education level: Not on file   Occupational History   Not on file  Tobacco Use   Smoking status: Some Days    Types: Cigars   Smokeless tobacco: Former    Types: Chew    Quit date: 02/20/2019   Tobacco comments:    Occasional cigar every 6 months  Vaping Use   Vaping Use: Former  Substance and Sexual Activity   Alcohol use: Yes    Alcohol/week: 0.0 standard drinks of alcohol    Comment: occas   Drug use: No   Sexual activity: Never  Other Topics Concern   Not on file  Social History Narrative   Not on file   Social Determinants of Health   Financial Resource Strain: Not on file  Food Insecurity: Not on file  Transportation Needs: Not on file  Physical Activity: Not on file  Stress: Not on file  Social Connections: Not on file    Additional Social History: see HPI  Allergies:   Allergies  Allergen Reactions   Augmentin [Amoxicillin-Pot Clavulanate]     Current Medications: Current Outpatient Medications  Medication Sig Dispense Refill   buPROPion (WELLBUTRIN XL) 150 MG 24 hr tablet Take one tablet by mouth daily 90 tablet 1   busPIRone (BUSPAR) 5 MG tablet Take one tablet by mouth  twice a day 60 tablet 11   DULoxetine (CYMBALTA) 30 MG capsule Take 2 capsules (60 mg total) by mouth once daily 90 capsule 3   No current facility-administered medications for this visit.    ROS: Review of Systems  Constitutional:  Negative for appetite change and unexpected weight change.  Gastrointestinal:  Negative for constipation, diarrhea, nausea and vomiting.  Endocrine: Positive for heat intolerance. Negative for cold intolerance.  Skin:        No hair loss  Psychiatric/Behavioral:  Negative for decreased concentration, dysphoric mood, hallucinations, self-injury, sleep disturbance and suicidal ideas. The patient is nervous/anxious. The patient is not hyperactive.    Objective:  Psychiatric Specialty Exam: There were no vitals taken for this visit.There is no height or weight on file to calculate  BMI.  General Appearance: Casual, Neat, and Well Groomed  Eye Contact:  Good  Speech:  Clear and Coherent and Normal Rate  Volume:  Normal  Mood:  Anxious  Affect:  Appropriate, Congruent, and Constricted  Thought Content: Logical and Hallucinations: None   Suicidal Thoughts:  No  Homicidal Thoughts:  No  Thought Process:  Coherent, Goal Directed, and Linear  Orientation:  Full (Time, Place, and Person)    Memory:  Immediate;   Fair Recent;   Fair Remote;   Fair  Judgment:  Fair  Insight:  Fair  Concentration:  Concentration: Good and Attention Span: Good  Recall:  Good  Fund of Knowledge: Good  Language: Good  Psychomotor Activity:  Normal  Akathisia:  No  AIMS (if indicated): not done  Assets:  Communication Skills Desire for Improvement Financial Resources/Insurance Housing Intimacy Leisure Time Physical Health Resilience Social Support Talents/Skills Transportation Vocational/Educational  ADL's:  Intact  Cognition: WNL  Sleep:  Fair   PE: General: sits comfortably in view of camera; no acute distress  Pulm: no increased work of breathing on room air  MSK: all extremity movements appear intact  Neuro: no focal neurological deficits observed  Gait & Station: unable to assess by video    Metabolic Disorder Labs: No results found for: "HGBA1C", "MPG" No results found for: "PROLACTIN" Lab Results  Component Value Date   CHOL 160 03/28/2017   TRIG 66 03/28/2017   HDL 45 03/28/2017   CHOLHDL 3.6 03/28/2017   LDLCALC 102 (H) 03/28/2017   Lab Results  Component Value Date   TSH 1.630 03/28/2017    Therapeutic Level Labs: No results found for: "LITHIUM" No results found for: "CBMZ" No results found for: "VALPROATE"  Screenings:  GAD-7    Flowsheet Row Office Visit from 01/11/2020 in [facility]  Total GAD-7 Score 0      PHQ2-9    Flowsheet Row Office Visit from 08/19/2022 in [facility] Office Visit from 11/22/2020 in  [facility] Office Visit from 01/11/2020 in [facility]  PHQ-2 Total Score 0 0 0  PHQ-9 Total Score 0 3 --       Collaboration of Care: Collaboration of Care: Medication Management AEB as above  Patient/Guardian was advised Release of Information must be obtained prior to any record release in order to collaborate their care with an outside provider. Patient/Guardian was advised if they have not already done so to contact the registration department to sign all necessary forms in order for us to release information regarding their care.   Consent: Patient/Guardian gives verbal consent for treatment and assignment of benefits for services provided during this visit. Patient/Guardian expressed understanding and agreed  to proceed.   Televisit via video: I connected with [name] on 09/17/22 at  1:00 PM EST by a video enabled telemedicine application and verified that I am speaking with the correct person using two identifiers.  Location: Patient: home in [place] Provider: home office   I discussed the limitations of evaluation and management by telemedicine and the availability of in person appointments. The patient expressed understanding and agreed to proceed.  I discussed the assessment and treatment plan with the patient. The patient was provided an opportunity to ask questions and all were answered. The patient agreed with the plan and demonstrated an understanding of the instructions.   The patient was advised to call back or seek an in-person evaluation if the symptoms worsen or if the condition fails to improve as anticipated.  I provided 60 minutes of non-face-to-face time during this encounter.  Elsie Lincoln, MD 11/28/202312:59 PM

## 2022-09-17 NOTE — Patient Instructions (Signed)
We discontinued your wellbutrin today which should help with some of the TMJ. If you can touch base with your dentist about a fitted night guard that would likely help. Also, try to gradually cut back on the amount of daily caffeine as this can also help with anxiety. I will message your PCP about checking a TSH and vitamin d level.

## 2022-09-18 ENCOUNTER — Other Ambulatory Visit: Payer: Self-pay | Admitting: Physician Assistant

## 2022-09-18 ENCOUNTER — Telehealth: Payer: Self-pay | Admitting: Physician Assistant

## 2022-09-18 DIAGNOSIS — E669 Obesity, unspecified: Secondary | ICD-10-CM

## 2022-09-18 DIAGNOSIS — F32A Depression, unspecified: Secondary | ICD-10-CM

## 2022-09-18 DIAGNOSIS — F4329 Adjustment disorder with other symptoms: Secondary | ICD-10-CM

## 2022-09-18 NOTE — Progress Notes (Signed)
Please, let pt know that his labs were ordered Our lab works between 8 and 4:30pm M-F

## 2022-09-19 NOTE — Telephone Encounter (Signed)
Called patient to advise.  Left message

## 2022-09-30 ENCOUNTER — Other Ambulatory Visit: Payer: Self-pay

## 2022-10-23 ENCOUNTER — Other Ambulatory Visit: Payer: Self-pay

## 2022-10-23 ENCOUNTER — Telehealth (HOSPITAL_COMMUNITY): Payer: Self-pay | Admitting: Psychiatry

## 2022-10-23 MED ORDER — BUSPIRONE HCL 5 MG PO TABS
5.0000 mg | ORAL_TABLET | Freq: Two times a day (BID) | ORAL | 11 refills | Status: DC
  Filled 2022-10-23: qty 60, 30d supply, fill #0
  Filled 2022-11-25: qty 60, 30d supply, fill #1
  Filled 2022-12-30: qty 60, 30d supply, fill #2

## 2022-10-24 ENCOUNTER — Other Ambulatory Visit: Payer: Self-pay

## 2022-10-25 DIAGNOSIS — F4329 Adjustment disorder with other symptoms: Secondary | ICD-10-CM | POA: Diagnosis not present

## 2022-10-25 DIAGNOSIS — F32A Depression, unspecified: Secondary | ICD-10-CM | POA: Diagnosis not present

## 2022-10-25 DIAGNOSIS — E669 Obesity, unspecified: Secondary | ICD-10-CM | POA: Diagnosis not present

## 2022-10-26 LAB — TSH: TSH: 2.26 u[IU]/mL (ref 0.450–4.500)

## 2022-10-26 LAB — VITAMIN D 25 HYDROXY (VIT D DEFICIENCY, FRACTURES): Vit D, 25-Hydroxy: 16.5 ng/mL — ABNORMAL LOW (ref 30.0–100.0)

## 2022-10-29 ENCOUNTER — Other Ambulatory Visit: Payer: Self-pay

## 2022-10-29 ENCOUNTER — Other Ambulatory Visit: Payer: Self-pay | Admitting: Physician Assistant

## 2022-10-29 NOTE — Progress Notes (Signed)
Pt was contacted and advised on OTC vit D 1,000-2,000 units daily.

## 2022-11-01 ENCOUNTER — Encounter (HOSPITAL_COMMUNITY): Payer: Self-pay | Admitting: Psychiatry

## 2022-11-01 ENCOUNTER — Telehealth (INDEPENDENT_AMBULATORY_CARE_PROVIDER_SITE_OTHER): Payer: 59 | Admitting: Psychiatry

## 2022-11-01 ENCOUNTER — Other Ambulatory Visit: Payer: Self-pay

## 2022-11-01 DIAGNOSIS — F411 Generalized anxiety disorder: Secondary | ICD-10-CM | POA: Diagnosis not present

## 2022-11-01 DIAGNOSIS — M26609 Unspecified temporomandibular joint disorder, unspecified side: Secondary | ICD-10-CM | POA: Diagnosis not present

## 2022-11-01 DIAGNOSIS — F1590 Other stimulant use, unspecified, uncomplicated: Secondary | ICD-10-CM

## 2022-11-01 DIAGNOSIS — F1291 Cannabis use, unspecified, in remission: Secondary | ICD-10-CM | POA: Diagnosis not present

## 2022-11-01 DIAGNOSIS — E559 Vitamin D deficiency, unspecified: Secondary | ICD-10-CM

## 2022-11-01 MED ORDER — DULOXETINE HCL 30 MG PO CPEP
90.0000 mg | ORAL_CAPSULE | Freq: Every day | ORAL | 1 refills | Status: DC
  Filled 2022-11-01: qty 90, 30d supply, fill #0

## 2022-11-01 NOTE — Progress Notes (Signed)
[facility] MD Outpatient Progress Note  11/01/2022 8:36 AM [name]  MRN:  [id number]  Assessment:  [name] presents for follow-up evaluation. Today, 11/01/22, patient reports no worsening of his anxiety or return of his depression with discontinuation of Wellbutrin.  In discussion, patient notes that when Cymbalta was started he did have improvement to his TMJ pain and anxiety with similar improvement to anxiety with starting BuSpar.  As such, will titrate Cymbalta today and again encouraged him to reach out to the dentist to see about getting a night guard.  Anxiety also likely improving or at least staying the same with being able to cut back to now 24 ounces of coffee daily with last drink around 11 AM.  PCP did check a vitamin D level and he was found to have a vitamin D level of 16 and is on a 5000 units daily supplement.  Follow-up in 1 month.  For safety, his acute risk factors for suicide are: none. His chronic risk factors are: chronic mental illness, history of aborted suicide attempt, past substance use. His protective factors are: supportive family, employment, minor children living in the home, beloved pets, actively seeking and engaging with mental/physical healthcare, contracting for safety, no SI. While future events cannot be fully predicted he is not currently meeting IVC criteria and can continue as an outpatient.   Identifying Information: [name] is a 27 y.o. male with a history of generalized anxiety disorder, MDD in remission with one lifetime aborted suicide attempt at age 4-15, TMJ, caffeine overuse, history of tobacco use disorder, history of cannabis use disorder in sustained remission, and childhood ODD who is an established patient with [facility] participating in follow-up via video conferencing. Initial evaluation of anxiety on 09/17/2022; see that note for full Case formulation.  Patient reported improving jaw pain since  starting cymbalta but ongoing anxious thoughts consistent with generalized anxiety disorder. He did have one lifetime aborted suicide attempt in adolescence and hasn't had SI since in discussion with patient and depression is still in remission. He was using an excessive amount of caffeine daily at 32-40oz and encouraged him to cut back as this may be a perpetuating reason for his anxiousness. He also recently quit smoking in October 2023. Remote use of cannabis last in 2021. He is not currently seeing psychotherapy and with relatively low amounts of daily anxiety it is reasonable to trial just changes above before establishing with therapy.    Plan:  # Generalized anxiety disorder  Caffeine over use Past medication trials: See med trials below Status of problem: Improving Interventions: -- Titrate cymbalta to 90mg  daily (s1/12/24) -- continue buspar 5mg  bid for now -- cut back on caffeine use  # TMJ Past medication trials:  Status of problem: Chronic and stable Interventions: -- cut back on caffeine -- ask dentist about night guard fitting  # Vitamin D deficiency Past medication trials:  Status of problem: New to provider Interventions: -- Continue vitamin D 5000 units daily supplement  # History of tobacco use disorder in early remission Past medication trials:  Status of problem: In remission Interventions: -- continue to encourage abstinence  # History of cannabis use disorder in sustained remission Past medication trials:  Status of problem: In remission Interventions: -- continue to encourage abstinence  # Depression, in remission  History of 1 lifetime suicide attempt Past medication trials:  Status of problem: In remission Interventions: -- continue to monitor for recurrence  Patient was  given contact information for behavioral health clinic and was instructed to call 911 for emergencies.   Subjective:  Chief Complaint:  Chief Complaint  Patient presents  with   Anxiety   Follow-up    Interval History: Doing okay.  Since he stopped the Wellbutrin has not noticed any worsening in his anxiety or depression.  Says depression is still gone for many years.  No return of SI.  Has been able to cut back on coffee use to 24 ounces daily but still noticing that he grinds his teeth with TMJ.  Has not been to see his dentist yet plans to make an appointment soon.  Has found both Cymbalta and BuSpar to be effective for his TMJ and anxiety respectively and was amenable to titration of Cymbalta today.  He has been taking this at night and we will have him switch to taking it in the morning.  His PCP checked a vitamin D level and found it to be 16 but was not told what supplement to start as far as dose goes so he started a 5000 unit daily dose.  He does go outside fairly often so is not sure how this got so low.  Visit Diagnosis:    ICD-10-CM   1. Caffeine overuse  F15.90     2. Generalized anxiety disorder  F41.1     3. TMJ (temporomandibular joint disorder)  M26.609     4. Vitamin D deficiency  E55.9       Past Psychiatric History:  Diagnoses: generalized anxiety disorder, MDD in remission with one lifetime aborted suicide attempt at age 67-15, TMJ, caffeine overuse, history of tobacco use disorder, history of cannabis use disorder in sustained remission, and childhood ODD Medication trials: cymbalta, wellbutrin, buspar. Zoloft, prozac, celexa.  Previous psychiatrist/therapist: yes Hospitalizations: yes as below Suicide attempts: mother stopped him at age 80-15 with plan to hang himself in woods behind house SIB: cut in late teens and early 12s on thighs, upper arms, wrist Hx of violence towards others: none Current access to guns: rifles, handgun stored in a gun safe Hx of abuse: none Substance use: No recent marijuana use  Past Medical History:  Past Medical History:  Diagnosis Date   Angioedema 09/02/2016   Depression    History of seizure     Oppositional defiant disorder    Seasonal allergies    Seizures ([place])    sinle seizure oct/2011   Urticaria    Wears glasses     Past Surgical History:  Procedure Laterality Date   sugery for arm fracture age 32      Family Psychiatric History: maternal great grandmother with depression   Family History:  Family History  Problem Relation Age of Onset   Allergies Mother    Alcohol abuse Father    Heart attack Father    Allergies Sister    Breast cancer Maternal Grandmother    Depression Paternal Grandmother     Social History:  Social History   Socioeconomic History   Marital status: Married    Spouse name: Not on file   Number of children: Not on file   Years of education: Not on file   Highest education level: Not on file  Occupational History   Not on file  Tobacco Use   Smoking status: Former    Types: Cigars, E-cigarettes    Quit date: 07/2022    Years since quitting: 0.2   Smokeless tobacco: Former    Types: Loss adjuster, chartered  Quit date: 02/20/2019  Vaping Use   Vaping Use: Former  Substance and Sexual Activity   Alcohol use: Not Currently    Comment: previously 1 beer at dinner or 6-7 drinks very infrequently socially   Drug use: Not Currently    Types: Marijuana    Comment: infrequent previously. Last use 2021   Sexual activity: Yes  Other Topics Concern   Not on file  Social History Narrative   Not on file   Social Determinants of Health   Financial Resource Strain: Not on file  Food Insecurity: Not on file  Transportation Needs: Not on file  Physical Activity: Not on file  Stress: Not on file  Social Connections: Not on file    Allergies:  Allergies  Allergen Reactions   Augmentin [Amoxicillin-Pot Clavulanate]     Current Medications: Current Outpatient Medications  Medication Sig Dispense Refill   Cholecalciferol (VITAMIN D) 125 MCG (5000 UT) CAPS Take 125 mcg by mouth daily.     busPIRone (BUSPAR) 5 MG tablet Take one tablet by mouth  twice a day 60 tablet 11   DULoxetine (CYMBALTA) 30 MG capsule Take 2 capsules (60 mg total) by mouth once daily 60 capsule 1   No current facility-administered medications for this visit.    ROS: Review of Systems  Musculoskeletal:        TMJ  Psychiatric/Behavioral:  Negative for decreased concentration, dysphoric mood, hallucinations, self-injury, sleep disturbance and suicidal ideas. The patient is nervous/anxious.     Objective:  Psychiatric Specialty Exam: There were no vitals taken for this visit.There is no height or weight on file to calculate BMI.  General Appearance: Casual, Neat, Well Groomed, and appears stated age  Eye Contact:  Good  Speech:  Clear and Coherent and Normal Rate  Volume:  Normal  Mood:   "Okay "  Affect:  Appropriate, Congruent, Constricted, and calm and cooperative  Thought Content: Logical and Hallucinations: None   Suicidal Thoughts:  No  Homicidal Thoughts:  No  Thought Process:  Coherent, Goal Directed, and Linear  Orientation:  Full (Time, Place, and Person)    Memory:  Immediate;   Good  Judgment:  Good  Insight:  Good  Concentration:  Concentration: Good and Attention Span: Good  Recall:  Good  Fund of Knowledge: Good  Language: Good  Psychomotor Activity:  Normal  Akathisia:  No  AIMS (if indicated): not done  Assets:  Communication Skills Desire for Improvement Financial Resources/Insurance Housing Intimacy Leisure Time Physical Health Resilience Social Support Talents/Skills Transportation Vocational/Educational  ADL's:  Intact  Cognition: WNL  Sleep:  Good   PE: General: sits comfortably in view of camera; no acute distress  Pulm: no increased work of breathing on room air  MSK: all extremity movements appear intact  Neuro: no focal neurological deficits observed  Gait & Station: unable to assess by video    Metabolic Disorder Labs: No results found for: "HGBA1C", "MPG" No results found for: "PROLACTIN" Lab  Results  Component Value Date   CHOL 160 03/28/2017   TRIG 66 03/28/2017   HDL 45 03/28/2017   CHOLHDL 3.6 03/28/2017   LDLCALC 102 (H) 03/28/2017   Lab Results  Component Value Date   TSH 2.260 10/25/2022   TSH 1.630 03/28/2017    Therapeutic Level Labs: No results found for: "LITHIUM" No results found for: "VALPROATE" No results found for: "CBMZ"  Screenings:  GAD-7    Flowsheet Row Office Visit from 01/11/2020 in [facility]  Total GAD-7 Score 0      PHQ2-9    [facility] Office Visit from 09/17/2022 in [facility] Office Visit from 08/19/2022 in [facility] Office Visit from 11/22/2020 in [facility] Office Visit from 01/11/2020 in [facility]  PHQ-2 Total Score 0 0 0 0  PHQ-9 Total Score -- 0 3 --      [facility] Office Visit from 09/17/2022 in [facility] No Risk       Collaboration of Care: Collaboration of Care: Medication Management AEB as above  Patient/Guardian was advised Release of Information must be obtained prior to any record release in order to collaborate their care with an outside provider. Patient/Guardian was advised if they have not already done so to contact the registration department to sign all necessary forms in order for [place] to release information regarding their care.   Consent: Patient/Guardian gives verbal consent for treatment and assignment of benefits for services provided during this visit. Patient/Guardian expressed understanding and agreed to proceed.   Televisit via video: I connected with [name] on 11/01/22 at  8:00 AM EST by a video enabled telemedicine application and verified that I am speaking with the correct person using two identifiers.  Location: Patient: Home Provider: Home office   I discussed the limitations of evaluation and management by  telemedicine and the availability of in person appointments. The patient expressed understanding and agreed to proceed.  I discussed the assessment and treatment plan with the patient. The patient was provided an opportunity to ask questions and all were answered. The patient agreed with the plan and demonstrated an understanding of the instructions.   The patient was advised to call back or seek an in-person evaluation if the symptoms worsen or if the condition fails to improve as anticipated.  I provided 15 minutes of non-face-to-face time during this encounter.  Jacquelynn Cree, MD 11/01/2022, 8:36 AM

## 2022-11-08 ENCOUNTER — Other Ambulatory Visit: Payer: Self-pay

## 2022-11-25 ENCOUNTER — Encounter (INDEPENDENT_AMBULATORY_CARE_PROVIDER_SITE_OTHER): Payer: Self-pay

## 2022-11-26 ENCOUNTER — Other Ambulatory Visit: Payer: Self-pay

## 2022-12-04 ENCOUNTER — Telehealth (INDEPENDENT_AMBULATORY_CARE_PROVIDER_SITE_OTHER): Payer: 59 | Admitting: Psychiatry

## 2022-12-04 ENCOUNTER — Encounter (HOSPITAL_COMMUNITY): Payer: Self-pay | Admitting: Psychiatry

## 2022-12-04 ENCOUNTER — Other Ambulatory Visit: Payer: Self-pay

## 2022-12-04 DIAGNOSIS — M26609 Unspecified temporomandibular joint disorder, unspecified side: Secondary | ICD-10-CM | POA: Diagnosis not present

## 2022-12-04 DIAGNOSIS — E559 Vitamin D deficiency, unspecified: Secondary | ICD-10-CM

## 2022-12-04 DIAGNOSIS — F1590 Other stimulant use, unspecified, uncomplicated: Secondary | ICD-10-CM

## 2022-12-04 DIAGNOSIS — F411 Generalized anxiety disorder: Secondary | ICD-10-CM

## 2022-12-04 MED ORDER — DULOXETINE HCL 60 MG PO CPEP
120.0000 mg | ORAL_CAPSULE | Freq: Every day | ORAL | 2 refills | Status: DC
  Filled 2022-12-04: qty 60, 30d supply, fill #0
  Filled 2022-12-30: qty 60, 30d supply, fill #1

## 2022-12-04 NOTE — Progress Notes (Signed)
[facility] MD Outpatient Progress Note  12/04/2022 9:15 AM [name]  MRN:  [id number]  Assessment:  [name] presents for follow-up evaluation. Today, 12/04/22, patient reports doing all right today similar to previous events.  Still not noting much shift with anxiety and will have periodic worsening of it depending on circumstances mostly related to finances.  Had brief elevation of his blood pressure at the first day of increased dose of Cymbalta but resolved thereafter. In discussion, patient notes that when Cymbalta was started he did have improvement to his TMJ pain and anxiety with similar improvement to anxiety with starting BuSpar.  As such, will titrate Cymbalta today and again encouraged him to reach out to the dentist to see about getting a night guard.  He also noted improvement to his anxiety with being able to cut back to 24 ounces of coffee daily with last drink around 11 AM, however his recent worsened anxiety does correspond to not sleeping as well due to grinding his teeth and drinking more coffee.    Follow-up in 1 month.  For safety, his acute risk factors for suicide are: none. His chronic risk factors are: chronic mental illness, history of aborted suicide attempt, past substance use. His protective factors are: supportive family, employment, minor children living in the home, beloved pets, actively seeking and engaging with mental/physical healthcare, contracting for safety, no SI. While future events cannot be fully predicted he is not currently meeting IVC criteria and can continue as an outpatient.   Identifying Information: [name] is a 27 y.o. male with a history of generalized anxiety disorder, MDD in remission with one lifetime aborted suicide attempt at age [age], TMJ, caffeine overuse, history of tobacco use disorder, history of cannabis use disorder in sustained remission, and childhood ODD who is an established patient with [facility] participating in follow-up via video conferencing. Initial evaluation of anxiety on 09/17/2022; see that note for full Case formulation.  Patient reported improving jaw pain since starting cymbalta but ongoing anxious thoughts consistent with generalized anxiety disorder. He did have one lifetime aborted suicide attempt in adolescence and hasn't had SI since in discussion with patient and depression is still in remission. He was using an excessive amount of caffeine daily at 32-40oz and encouraged him to cut back as this may be a perpetuating reason for his anxiousness. He also recently quit smoking in October 2023. Remote use of cannabis last in 2021. He is not currently seeing psychotherapy and with relatively low amounts of daily anxiety it is reasonable to trial just changes above before establishing with therapy. No worsening of his anxiety or return of his depression with discontinuation of Wellbutrin. PCP did check a vitamin D level and he was found to have a vitamin D level of 16 and is on a 5000 units daily supplement.  Plan:  # Generalized anxiety disorder  Caffeine over use Past medication trials: See med trials below Status of problem: chronic and with mild exacerbation Interventions: -- Titrate cymbalta to 56m daily (s1/12/24) -- continue buspar 569mbid for now -- cut back on caffeine use  # TMJ Past medication trials:  Status of problem: Chronic with mild exacerbation Interventions: -- cut back on caffeine -- ask dentist about night guard fitting  # Vitamin D deficiency Past medication trials:  Status of problem: Chronic stable Interventions: -- Continue vitamin D 5000 units daily supplement  # History of tobacco use disorder in early remission Past medication  trials:  Status of problem: In remission Interventions: -- continue to encourage abstinence  # History of cannabis use disorder in sustained remission Past medication trials:  Status of  problem: In remission Interventions: -- continue to encourage abstinence  # Depression, in remission  History of 1 lifetime suicide attempt Past medication trials:  Status of problem: In remission Interventions: -- continue to monitor for recurrence  Patient was given contact information for behavioral health clinic and was instructed to call 911 for emergencies.   Subjective:  Chief Complaint:  Chief Complaint  Patient presents with   Anxiety   Stress   Follow-up    Interval History: Doing alright. With increased dose of cymbalta the day he started it had some chest pain with BP of 180 and then normalized. Hard to tell if anxiety/depression changing much at all. Some days doesn't feel different and will have break through anxiety. Money concerns and trying to get vehicles fixed tend to set off the anxiety. Still no panic attacks though. Noticing a lot of issues with TMJ and thinks it may be related to his pillow; has been clenching a lot more at night. Has been waking him up. Coffee still at 24oz per day; has been increased in last few days and noticing that he will sleep for an hour wake up for a few minutes sleep for an hour wake up for a few minutes but to grinding pain.  Encouraged him to try and cut back on coffee due to positive feedback loop and he has noticed when he has cut back for that it helps with anxiety; especially in the morning.  Visit Diagnosis:    ICD-10-CM   1. Generalized anxiety disorder  F41.1     2. TMJ (temporomandibular joint disorder)  M26.609     3. Caffeine overuse  F15.90     4. Vitamin D deficiency  E55.9       Past Psychiatric History:  Diagnoses: generalized anxiety disorder, MDD in remission with one lifetime aborted suicide attempt at age 35-15, TMJ, caffeine overuse, history of tobacco use disorder, history of cannabis use disorder in sustained remission, and childhood ODD Medication trials: cymbalta, wellbutrin, buspar. Zoloft, prozac,  celexa.  Previous psychiatrist/therapist: yes Hospitalizations: yes as below Suicide attempts: mother stopped him at age 25-15 with plan to hang himself in woods behind house SIB: cut in late teens and early 60s on thighs, upper arms, wrist Hx of violence towards others: none Current access to guns: rifles, handgun stored in a gun safe Hx of abuse: none Substance use: No recent marijuana use  Past Medical History:  Past Medical History:  Diagnosis Date   Angioedema 09/02/2016   Depression    History of seizure    Oppositional defiant disorder    Seasonal allergies    Seizures ([place])    sinle seizure oct/2011   Urticaria    Wears glasses     Past Surgical History:  Procedure Laterality Date   sugery for arm fracture age 68      Family Psychiatric History: maternal great grandmother with depression   Family History:  Family History  Problem Relation Age of Onset   Allergies Mother    Alcohol abuse Father    Heart attack Father    Allergies Sister    Breast cancer Maternal Grandmother    Depression Paternal Grandmother     Social History:  Social History   Socioeconomic History   Marital status: Married    Spouse name: Not  on file   Number of children: Not on file   Years of education: Not on file   Highest education level: Not on file  Occupational History   Not on file  Tobacco Use   Smoking status: Former    Types: Cigars, E-cigarettes    Quit date: 07/2022    Years since quitting: 0.3   Smokeless tobacco: Former    Types: Chew    Quit date: 02/20/2019  Vaping Use   Vaping Use: Former  Substance and Sexual Activity   Alcohol use: Not Currently    Comment: previously 1 beer at dinner or 6-7 drinks very infrequently socially   Drug use: Not Currently    Types: Marijuana    Comment: infrequent previously. Last use 2021   Sexual activity: Yes  Other Topics Concern   Not on file  Social History Narrative   Not on file   Social Determinants of Health    Financial Resource Strain: Not on file  Food Insecurity: Not on file  Transportation Needs: Not on file  Physical Activity: Not on file  Stress: Not on file  Social Connections: Not on file    Allergies:  Allergies  Allergen Reactions   Augmentin [Amoxicillin-Pot Clavulanate]     Current Medications: Current Outpatient Medications  Medication Sig Dispense Refill   busPIRone (BUSPAR) 5 MG tablet Take one tablet by mouth twice a day 60 tablet 11   Cholecalciferol (VITAMIN D) 125 MCG (5000 UT) CAPS Take 125 mcg by mouth daily.     DULoxetine (CYMBALTA) 30 MG capsule Take 3 capsules (90 mg total) by mouth daily. 90 capsule 1   No current facility-administered medications for this visit.    ROS: Review of Systems  Musculoskeletal:        TMJ  Psychiatric/Behavioral:  Negative for decreased concentration, dysphoric mood, hallucinations, self-injury, sleep disturbance and suicidal ideas. The patient is nervous/anxious.     Objective:  Psychiatric Specialty Exam: There were no vitals taken for this visit.There is no height or weight on file to calculate BMI.  General Appearance: Casual, Neat, Well Groomed, and appears stated age  Eye Contact:  Good  Speech:  Clear and Coherent and Normal Rate  Volume:  Normal  Mood:   "All right"  Affect:  Appropriate, Congruent, Constricted, and calm and cooperative  Thought Content: Logical and Hallucinations: None   Suicidal Thoughts:  No  Homicidal Thoughts:  No  Thought Process:  Coherent, Goal Directed, and Linear  Orientation:  Full (Time, Place, and Person)    Memory:  Immediate;   Good  Judgment:  Good  Insight:  Good  Concentration:  Concentration: Good and Attention Span: Good  Recall:  Good  Fund of Knowledge: Good  Language: Good  Psychomotor Activity:  Normal  Akathisia:  No  AIMS (if indicated): not done  Assets:  Communication Skills Desire for Improvement Financial Resources/Insurance Housing Intimacy Leisure  Time [place] Talents/Skills Transportation Vocational/Educational  ADL's:  Intact  Cognition: WNL  Sleep:  Fair   PE: General: sits comfortably in view of camera; no acute distress  Pulm: no increased work of breathing on room air  MSK: all extremity movements appear intact  Neuro: no focal neurological deficits observed  Gait & Station: unable to assess by video    Metabolic Disorder Labs: No results found for: "HGBA1C", "MPG" No results found for: "PROLACTIN" Lab Results  Component Value Date   CHOL 160 03/28/2017   TRIG 66 03/28/2017  HDL 45 03/28/2017   CHOLHDL 3.6 03/28/2017   LDLCALC 102 (H) 03/28/2017   Lab Results  Component Value Date   TSH 2.260 10/25/2022   TSH 1.630 03/28/2017    Therapeutic Level Labs: No results found for: "LITHIUM" No results found for: "VALPROATE" No results found for: "CBMZ"  Screenings:  GAD-7    Flowsheet Row Office Visit from 01/11/2020 in [facility]  Total GAD-7 Score 0      PHQ2-9    [place] Office Visit from 09/17/2022 in [facility] from 08/19/2022 in [facility] Office Visit from 11/22/2020 in [facility] Visit from 01/11/2020 in [facility]  PHQ-2 Total Score 0 0 0 0  PHQ-9 Total Score -- 0 3 --      [place] Office Visit from 09/17/2022 in [facility] at [facility] No Risk       Collaboration of Care: Collaboration of Care: Medication Management AEB as above  Patient/Guardian was advised Release of Information must be obtained prior to any record release in order to collaborate their care with an outside provider. Patient/Guardian was advised if they have not already done so to contact the registration department to sign all necessary forms in order for [place] to release information  regarding their care.   Consent: Patient/Guardian gives verbal consent for treatment and assignment of benefits for services provided during this visit. Patient/Guardian expressed understanding and agreed to proceed.   Televisit via video: I connected with [name] on 12/04/22 at  9:00 AM EST by a video enabled telemedicine application and verified that I am speaking with the correct person using two identifiers.  Location: Patient: Home Provider: [facility]   I discussed the limitations of evaluation and management by telemedicine and the availability of in person appointments. The patient expressed understanding and agreed to proceed.  I discussed the assessment and treatment plan with the patient. The patient was provided an opportunity to ask questions and all were answered. The patient agreed with the plan and demonstrated an understanding of the instructions.   The patient was advised to call back or seek an in-person evaluation if the symptoms worsen or if the condition fails to improve as anticipated.  I provided 15 minutes of non-face-to-face time during this encounter.  Jacquelynn Cree, MD 12/04/2022, 9:15 AM

## 2022-12-04 NOTE — Patient Instructions (Signed)
We increased the Cymbalta to 120 mg once daily today.  Do best to keep trying to cut back on caffeine as this should help the anxiety as well as the TMJ which is likely a result of your anxiety.

## 2022-12-31 ENCOUNTER — Other Ambulatory Visit: Payer: Self-pay

## 2023-01-10 ENCOUNTER — Telehealth (HOSPITAL_COMMUNITY): Payer: 59 | Admitting: Psychiatry

## 2023-01-14 ENCOUNTER — Telehealth (INDEPENDENT_AMBULATORY_CARE_PROVIDER_SITE_OTHER): Payer: 59 | Admitting: Psychiatry

## 2023-01-14 ENCOUNTER — Other Ambulatory Visit: Payer: Self-pay

## 2023-01-14 ENCOUNTER — Encounter (HOSPITAL_COMMUNITY): Payer: Self-pay | Admitting: Psychiatry

## 2023-01-14 DIAGNOSIS — M26609 Unspecified temporomandibular joint disorder, unspecified side: Secondary | ICD-10-CM

## 2023-01-14 DIAGNOSIS — F411 Generalized anxiety disorder: Secondary | ICD-10-CM | POA: Diagnosis not present

## 2023-01-14 DIAGNOSIS — F1590 Other stimulant use, unspecified, uncomplicated: Secondary | ICD-10-CM | POA: Diagnosis not present

## 2023-01-14 DIAGNOSIS — E559 Vitamin D deficiency, unspecified: Secondary | ICD-10-CM

## 2023-01-14 MED ORDER — BUSPIRONE HCL 5 MG PO TABS
5.0000 mg | ORAL_TABLET | Freq: Two times a day (BID) | ORAL | 2 refills | Status: DC
  Filled 2023-01-14 – 2023-01-25 (×2): qty 60, 30d supply, fill #0
  Filled 2023-02-24: qty 60, 30d supply, fill #1
  Filled 2023-03-28: qty 60, 30d supply, fill #2

## 2023-01-14 MED ORDER — DULOXETINE HCL 60 MG PO CPEP
120.0000 mg | ORAL_CAPSULE | Freq: Every day | ORAL | 2 refills | Status: DC
  Filled 2023-01-14 – 2023-01-25 (×2): qty 60, 30d supply, fill #0
  Filled 2023-02-24: qty 60, 30d supply, fill #1
  Filled 2023-03-28: qty 60, 30d supply, fill #2

## 2023-01-14 NOTE — Patient Instructions (Signed)
We did not make any medication changes today.  Do try to cut back on the amount of caffeine you are consuming to roughly 8 fluid ounces of coffee per day.  You can decrease by 8 fluid ounces every week and I would titrate to the headaches that he will likely have further caffeine withdrawal.  To reach out to PT is a can be a good benefit for TMJ as well as her dentist for further nightguard.

## 2023-01-14 NOTE — Progress Notes (Signed)
[facility] Outpatient Progress Note  01/14/2023 1:16 PM [name]  MRN:  [id number]  Assessment:  [name] presents for follow-up evaluation. Today, 01/14/23, patient reports doing well today similar to previous visits.  Does think that his anxiety is largely in remission outside of having some anxiousness around events that should cause anxiety.  Caffeine still at 24 ounces of coffee daily and reviewed, as in previous visits, that the TMJ is likely made worse from this excessive amount of caffeine.  He will try to cut back and will also look into PT and potentially getting a fitted nightguard.  He is not using alarms on his phone every hour to relax the muscles of his head and neck and knows that if he sets them any more frequently he will probably ignore them.  Follow-up in 2 months due to clinical stability.  For safety, his acute risk factors for suicide are: none. His chronic risk factors are: chronic mental illness, history of aborted suicide attempt, past substance use. His protective factors are: supportive family, employment, minor children living in the home, beloved pets, actively seeking and engaging with mental/physical healthcare, contracting for safety, no SI. While future events cannot be fully predicted he is not currently meeting IVC criteria and can continue as an outpatient.   Identifying Information: [name] is a 27 y.o. male with a history of generalized anxiety disorder, MDD in remission with one lifetime aborted suicide attempt at age 65-15, TMJ, caffeine overuse, history of tobacco use disorder, history of cannabis use disorder in sustained remission, and childhood ODD who is an established patient with [facility] participating in follow-up via video conferencing. Initial evaluation of anxiety on 09/17/2022; see that note for full Case formulation.  Patient reported improving jaw pain since starting cymbalta but ongoing anxious  thoughts consistent with generalized anxiety disorder. He did have one lifetime aborted suicide attempt in adolescence and hasn't had SI since in discussion with patient and depression is still in remission. He was using an excessive amount of caffeine daily at 32-40oz and encouraged him to cut back as this may be a perpetuating reason for his anxiousness. He also recently quit smoking in October 2023. Remote use of cannabis last in 2021. He is not currently seeing psychotherapy and with relatively low amounts of daily anxiety it is reasonable to trial just changes above before establishing with therapy. No worsening of his anxiety or return of his depression with discontinuation of Wellbutrin. PCP did check a vitamin D level and he was found to have a vitamin D level of 16 and is on a 5000 units daily supplement.  Plan:  # Generalized anxiety disorder  Past medication trials: See med trials below Status of problem: Improving Interventions: -- continue cymbalta to 120mg  daily (s1/12/24) -- continue buspar 5mg  bid for now -- cut back on caffeine use  # TMJ  Caffeine over use Past medication trials:  Status of problem: Chronic with mild exacerbation Interventions: -- cut back on caffeine -- ask dentist about night guard fitting  # Vitamin D deficiency Past medication trials:  Status of problem: Improving Interventions: -- Continue vitamin D 5000 units daily supplement  # History of tobacco use disorder in early remission Past medication trials:  Status of problem: In remission Interventions: -- continue to encourage abstinence  # History of cannabis use disorder in sustained remission Past medication trials:  Status of problem: In remission Interventions: -- continue to encourage abstinence  #  Depression, in remission  History of 1 lifetime suicide attempt Past medication trials:  Status of problem: In remission Interventions: -- continue to monitor for recurrence  Patient  was given contact information for behavioral health clinic and was instructed to call 911 for emergencies.   Subjective:  Chief Complaint:  Chief Complaint  Patient presents with   Anxiety   Caffeine overuse   Temporomandibular Joint Pain   Follow-up    Interval History: Doing well with the increased dose of cymbalta; not having any consistent issues with anxiety. Still has some breakthroughs of things that should make him anxious. His jaw has been bothering him significantly more in the last week. A contour pillow made it worse but switched and it calmed down a bit. Doesn't think he is clenching but if holding his jaw in a certain way can upset it. Having a reminder on his phone every hour and has helped. Looked into PT and encouraged to follow up with them. Coffee still at 24oz per day.  Encouraged him to try and cut back on coffee due to positive feedback loop and he has noticed when he has cut back for that it helps with anxiety; especially in the morning.  Visit Diagnosis:    ICD-10-CM   1. Vitamin D deficiency  E55.9     2. Generalized anxiety disorder  F41.1     3. TMJ (temporomandibular joint disorder)  M26.609     4. Caffeine overuse  F15.90       Past Psychiatric History:  Diagnoses: generalized anxiety disorder, MDD in remission with one lifetime aborted suicide attempt at age [age], TMJ, caffeine overuse, history of tobacco use disorder, history of cannabis use disorder in sustained remission, and childhood ODD Medication trials: cymbalta, wellbutrin, buspar. Zoloft, prozac, celexa.  Previous psychiatrist/therapist: yes Hospitalizations: yes as below Suicide attempts: mother stopped him at age 57-15 with plan to hang himself in woods behind house SIB: cut in late teens and early 87s on thighs, upper arms, wrist Hx of violence towards others: none Current access to guns: rifles, handgun stored in a gun safe Hx of abuse: none Substance use: No recent marijuana  use  Past Medical History:  Past Medical History:  Diagnosis Date   Angioedema 09/02/2016   Depression    History of seizure    Oppositional defiant disorder    Seasonal allergies    Seizures ([place])    sinle seizure oct/2011   Urticaria    Wears glasses     Past Surgical History:  Procedure Laterality Date   sugery for arm fracture age 24      Family Psychiatric History: maternal great grandmother with depression   Family History:  Family History  Problem Relation Age of Onset   Allergies Mother    Alcohol abuse Father    Heart attack Father    Allergies Sister    Breast cancer Maternal Grandmother    Depression Paternal Grandmother     Social History:  Social History   Socioeconomic History   Marital status: Married    Spouse name: Not on file   Number of children: Not on file   Years of education: Not on file   Highest education level: Not on file  Occupational History   Not on file  Tobacco Use   Smoking status: Former    Types: Cigars, E-cigarettes    Quit date: 07/2022    Years since quitting: 0.4   Smokeless tobacco: Former    Types: Loss adjuster, chartered  Quit date: 02/20/2019  Vaping Use   Vaping Use: Former  Substance and Sexual Activity   Alcohol use: Not Currently    Comment: previously 1 beer at dinner or 6-7 drinks very infrequently socially   Drug use: Not Currently    Types: Marijuana    Comment: infrequent previously. Last use 2021   Sexual activity: Yes  Other Topics Concern   Not on file  Social History Narrative   Not on file   Social Determinants of Health   Financial Resource Strain: Not on file  Food Insecurity: Not on file  Transportation Needs: Not on file  Physical Activity: Not on file  Stress: Not on file  Social Connections: Not on file    Allergies:  Allergies  Allergen Reactions   Augmentin [Amoxicillin-Pot Clavulanate]     Current Medications: Current Outpatient Medications  Medication Sig Dispense Refill   busPIRone  (BUSPAR) 5 MG tablet Take one tablet by mouth twice a day 60 tablet 11   Cholecalciferol (VITAMIN D) 125 MCG (5000 UT) CAPS Take 125 mcg by mouth daily.     DULoxetine (CYMBALTA) 60 MG capsule Take 2 capsules (120 mg total) by mouth daily. 60 capsule 2   No current facility-administered medications for this visit.    ROS: Review of Systems  Musculoskeletal:        TMJ  Psychiatric/Behavioral:  Negative for decreased concentration, dysphoric mood, hallucinations, self-injury, sleep disturbance and suicidal ideas. The patient is not nervous/anxious.     Objective:  Psychiatric Specialty Exam: There were no vitals taken for this visit.There is no height or weight on file to calculate BMI.  General Appearance: Casual, Neat, Well Groomed, and appears stated age  Eye Contact:  Good  Speech:  Clear and Coherent and Normal Rate  Volume:  Normal  Mood:   "Good"  Affect:  Appropriate, Congruent, Constricted, and calm and cooperative  Thought Content: Logical and Hallucinations: None   Suicidal Thoughts:  No  Homicidal Thoughts:  No  Thought Process:  Coherent, Goal Directed, and Linear  Orientation:  Full (Time, Place, and Person)    Memory:  Immediate;   Good  Judgment:  Good  Insight:  Good  Concentration:  Concentration: Good and Attention Span: Good  Recall:  Good  Fund of Knowledge: Good  Language: Good  Psychomotor Activity:  Normal  Akathisia:  No  AIMS (if indicated): not done  Assets:  Communication Skills Desire for Improvement Financial Resources/Insurance Housing Intimacy Leisure Time [place] Talents/Skills Transportation Vocational/Educational  ADL's:  Intact  Cognition: WNL  Sleep:  Fair   PE: General: sits comfortably in view of camera; no acute distress  Pulm: no increased work of breathing on room air  MSK: all extremity movements appear intact  Neuro: no focal neurological deficits observed  Gait & Station: unable  to assess by video    Metabolic Disorder Labs: No results found for: "HGBA1C", "MPG" No results found for: "PROLACTIN" Lab Results  Component Value Date   CHOL 160 03/28/2017   TRIG 66 03/28/2017   HDL 45 03/28/2017   CHOLHDL 3.6 03/28/2017   LDLCALC 102 (H) 03/28/2017   Lab Results  Component Value Date   TSH 2.260 10/25/2022   TSH 1.630 03/28/2017    Therapeutic Level Labs: No results found for: "LITHIUM" No results found for: "VALPROATE" No results found for: "CBMZ"  Screenings:  GAD-7    Flowsheet Row Office Visit from 01/11/2020 in [facility]  Total  GAD-7 Score 0      PHQ2-9    [facility] Office Visit from 09/17/2022 in [facility] Visit from 08/19/2022 in [facility] Office Visit from 11/22/2020 in [facility] Office Visit from 01/11/2020 in [facility] [facility]  PHQ-2 Total Score 0 0 0 0  PHQ-9 Total Score -- 0 3 --      [facility] Office Visit from 09/17/2022 in [facility] No Risk       Collaboration of Care: Collaboration of Care: Medication Management AEB as above  Patient/Guardian was advised Release of Information must be obtained prior to any record release in order to collaborate their care with an outside provider. Patient/Guardian was advised if they have not already done so to contact the registration department to sign all necessary forms in order for [place] to release information regarding their care.   Consent: Patient/Guardian gives verbal consent for treatment and assignment of benefits for services provided during this visit. Patient/Guardian expressed understanding and agreed to proceed.   Televisit via video: I connected with Lytle on 01/14/23 at  1:00 PM EDT by a video enabled telemedicine application and verified that I am speaking with the correct person using  two identifiers.  Location: Patient: truck near [employer] Provider: [facility]   I discussed the limitations of evaluation and management by telemedicine and the availability of in person appointments. The patient expressed understanding and agreed to proceed.  I discussed the assessment and treatment plan with the patient. The patient was provided an opportunity to ask questions and all were answered. The patient agreed with the plan and demonstrated an understanding of the instructions.   The patient was advised to call back or seek an in-person evaluation if the symptoms worsen or if the condition fails to improve as anticipated.  I provided 15 minutes of non-face-to-face time during this encounter.  Jacquelynn Cree, MD 01/14/2023, 1:16 PM

## 2023-01-26 ENCOUNTER — Other Ambulatory Visit: Payer: Self-pay

## 2023-02-17 ENCOUNTER — Encounter (HOSPITAL_COMMUNITY): Payer: Self-pay | Admitting: *Deleted

## 2023-02-17 ENCOUNTER — Other Ambulatory Visit: Payer: Self-pay

## 2023-02-17 ENCOUNTER — Emergency Department (HOSPITAL_COMMUNITY)
Admission: EM | Admit: 2023-02-17 | Discharge: 2023-02-17 | Disposition: A | Discharge status: Home / Self Care | Payer: 59 | Attending: Emergency Medicine | Admitting: Emergency Medicine

## 2023-02-17 ENCOUNTER — Emergency Department (HOSPITAL_COMMUNITY): Payer: 59

## 2023-02-17 DIAGNOSIS — S6992XA Unspecified injury of left wrist, hand and finger(s), initial encounter: Secondary | ICD-10-CM | POA: Diagnosis present

## 2023-02-17 DIAGNOSIS — S62633B Displaced fracture of distal phalanx of left middle finger, initial encounter for open fracture: Secondary | ICD-10-CM | POA: Diagnosis not present

## 2023-02-17 DIAGNOSIS — S62639B Displaced fracture of distal phalanx of unspecified finger, initial encounter for open fracture: Secondary | ICD-10-CM

## 2023-02-17 DIAGNOSIS — Z23 Encounter for immunization: Secondary | ICD-10-CM | POA: Insufficient documentation

## 2023-02-17 DIAGNOSIS — S61311A Laceration without foreign body of left index finger with damage to nail, initial encounter: Secondary | ICD-10-CM

## 2023-02-17 DIAGNOSIS — W312XXA Contact with powered woodworking and forming machines, initial encounter: Secondary | ICD-10-CM | POA: Insufficient documentation

## 2023-02-17 DIAGNOSIS — S61213A Laceration without foreign body of left middle finger without damage to nail, initial encounter: Secondary | ICD-10-CM | POA: Diagnosis not present

## 2023-02-17 DIAGNOSIS — S62621B Displaced fracture of medial phalanx of left index finger, initial encounter for open fracture: Secondary | ICD-10-CM | POA: Insufficient documentation

## 2023-02-17 DIAGNOSIS — S61211A Laceration without foreign body of left index finger without damage to nail, initial encounter: Secondary | ICD-10-CM | POA: Insufficient documentation

## 2023-02-17 DIAGNOSIS — S61313A Laceration without foreign body of left middle finger with damage to nail, initial encounter: Secondary | ICD-10-CM | POA: Diagnosis not present

## 2023-02-17 MED ORDER — CEFAZOLIN SODIUM-DEXTROSE 1-4 GM/50ML-% IV SOLN: 1.0000 g | Freq: Once | INTRAVENOUS | Status: DC

## 2023-02-17 MED ORDER — CEFAZOLIN SODIUM-DEXTROSE 2-4 GM/100ML-% IV SOLN
2.0000 g | INTRAVENOUS | Status: AC
  Administered 2023-02-17: 2 g via INTRAVENOUS
  Filled 2023-02-17: qty 100

## 2023-02-17 MED ORDER — OXYCODONE-ACETAMINOPHEN 5-325 MG PO TABS
1.0000 | ORAL_TABLET | Freq: Once | ORAL | Status: AC
  Administered 2023-02-17: 1 via ORAL
  Filled 2023-02-17: qty 1

## 2023-02-17 MED ORDER — LIDOCAINE HCL (PF) 2 % IJ SOLN
INTRAMUSCULAR | Status: AC
  Filled 2023-02-17: qty 5

## 2023-02-17 MED ORDER — POVIDONE-IODINE 10 % EX SOLN
CUTANEOUS | Status: DC | PRN
  Filled 2023-02-17: qty 14.8

## 2023-02-17 MED ORDER — CEPHALEXIN 500 MG PO CAPS: 500.0000 mg | ORAL_CAPSULE | Freq: Four times a day (QID) | ORAL | 0 refills | Status: DC

## 2023-02-17 MED ORDER — TETANUS-DIPHTH-ACELL PERTUSSIS 5-2.5-18.5 LF-MCG/0.5 IM SUSY
0.5000 mL | PREFILLED_SYRINGE | Freq: Once | INTRAMUSCULAR | Status: AC
  Administered 2023-02-17: 0.5 mL via INTRAMUSCULAR
  Filled 2023-02-17: qty 0.5

## 2023-02-17 MED ORDER — OXYCODONE-ACETAMINOPHEN 5-325 MG PO TABS: 1.0000 | ORAL_TABLET | ORAL | 0 refills | Status: DC | PRN

## 2023-02-17 MED ORDER — LIDOCAINE HCL (PF) 2 % IJ SOLN: 10.0000 mL | Freq: Once | INTRAMUSCULAR | Status: AC

## 2023-02-17 MED ORDER — HYDROMORPHONE HCL 1 MG/ML IJ SOLN
0.5000 mg | Freq: Once | INTRAMUSCULAR | Status: AC
  Administered 2023-02-17: 0.5 mg via INTRAVENOUS
  Filled 2023-02-17: qty 0.5

## 2023-02-17 NOTE — Discharge Instructions (Signed)
Keep fingers clean and bandaged.  Elevate your hand when possible.  Call Dr. Mort Sawyers office to arrange follow-up appointment for this week.

## 2023-02-17 NOTE — ED Triage Notes (Signed)
Pt with lac to tips of left index and middles finger from table saw.  Last tetanus shot is unknown.

## 2023-02-17 NOTE — ED Provider Notes (Signed)
[facility] EMERGENCY DEPARTMENT AT [facility] Provider Note   CSN: [id number] Arrival date & time: 02/17/23  1158     History  Chief Complaint  Patient presents with   Finger Injury    [name] is a 27 y.o. male.  HPI      [name] is a 27 y.o. male with past medical history of angioedema who presents to the Emergency Department complaining of injuries to the left distal index and middle finger.  Was using a table saw to cut a piece of wood cutting the lateral aspects of the distal fingers.  Last Td is unknown.  Bleeding controlled prior to arrival using direct pressure.  Denies any swelling or numbness of his fingers.  Was not wearing gloves when the injury occurred.  He doubts foreign body.    Home Medications Prior to Admission medications   Medication Sig Start Date End Date Taking? Authorizing Provider  busPIRone (BUSPAR) 5 MG tablet Take 1 tablet (5 mg total) by mouth 2 (two) times daily. 01/14/23   Elsie Lincoln, MD  Cholecalciferol (VITAMIN D) 125 MCG (5000 UT) CAPS Take 125 mcg by mouth daily.    [provider]  DULoxetine (CYMBALTA) 60 MG capsule Take 2 capsules (120 mg total) by mouth daily. 01/14/23   Elsie Lincoln, MD      Allergies    Augmentin [amoxicillin-pot clavulanate]    Review of Systems   Review of Systems  Constitutional:  Negative for chills and fever.  Respiratory:  Negative for shortness of breath.   Cardiovascular:  Negative for chest pain.  Gastrointestinal:  Negative for nausea and vomiting.  Musculoskeletal:  Positive for arthralgias.  Skin:  Positive for wound. Negative for color change.  Neurological:  Negative for dizziness, syncope, weakness and numbness.  Psychiatric/Behavioral:  Negative for confusion.     Physical Exam Updated Vital Signs BP 131/87 (BP Location: Right Arm)   Pulse 77   Temp 98 F (36.7 C)   Resp 18   Ht 5\' 7"  (1.702 m)   Wt 90.7 kg   SpO2 100%   BMI 31.32  kg/m  Physical Exam Vitals and nursing note reviewed.  Constitutional:      General: He is not in acute distress.    Appearance: Normal appearance. He is not ill-appearing or toxic-appearing.  HENT:     Head: Atraumatic.  Cardiovascular:     Rate and Rhythm: Normal rate and regular rhythm.     Pulses: Normal pulses.  Pulmonary:     Effort: Pulmonary effort is normal.  Musculoskeletal:        General: Tenderness and signs of injury present. No swelling.     Comments: Marcerated lacerations to the distal left middle and index fingers.  Partial avulsion of the lateral portion of the nail on the index finger, remaining nail intact  no FB's seen  See attached photo  Pt gave verbal consent for image to be stored in medical record  Skin:    General: Skin is warm.     Capillary Refill: Capillary refill takes less than 2 seconds.  Neurological:     General: No focal deficit present.     Mental Status: He is alert.     Sensory: No sensory deficit.     Motor: No weakness.     ED Results / Procedures / Treatments   Labs (all labs ordered are listed, but only abnormal results are displayed) Labs Reviewed - No  data to display  EKG None  Radiology DG Hand Complete Left  Result Date: 02/17/2023 CLINICAL DATA:  Lacerations from table saw. EXAM: LEFT HAND - COMPLETE 3+ VIEW COMPARISON:  None Available. FINDINGS: Laceration is seen involving distal portion of third finger medial to distal interphalangeal joint. There appears to be comminuted fracture involving the proximal base of the third distal phalanx. Some debris may be present within the wound is well. IMPRESSION: Laceration seen medial to distal interphalangeal joint of middle finger with probable comminuted fracture involving proximal base of third distal phalanx. Some debris may be present within the wound. Electronically Signed   By: Lupita Raider M.D.   On: 02/17/2023 12:43    Procedures Procedures    LACERATION REPAIR  #1 Performed by: Arlita Buffkin Authorized by: Vanessa Alesi Consent: Verbal consent obtained. Risks and benefits: risks, benefits and alternatives were discussed Consent given by: patient Patient identity confirmed: provided demographic data Prepped and Draped in normal sterile fashion Wound explored  Laceration Location: distal left middle finger  Laceration Length: 1.5 cm  No Foreign Bodies seen or palpated  Anesthesia: digital block Local anesthetic: lidocaine 2% w/o epinephrine  Anesthetic total: 2.5 ml  Irrigation method: syringe Amount of cleaning: standard  Wound explored through entire depth of wound, no foreign body seen, no injury of the nail  Skin closure: 4-0 Ethilon  Number of sutures: 4  Technique: simple interrupted, loosely approximated  Patient tolerance: Patient tolerated the procedure well with no immediate complications.   LACERATION REPAIR #2 Performed by: Amilia Vandenbrink Authorized by: Kayvion Arneson Consent: Verbal consent obtained. Risks and benefits: risks, benefits and alternatives were discussed Consent given by: patient Patient identity confirmed: provided demographic data Prepped and Draped in normal sterile fashion Wound explored  Laceration Location: left index finger  Laceration Length: 1 cm  No Foreign Bodies seen or palpated  Anesthesia: digital block Local anesthetic: lidocaine 2% w/o  epinephrine  Anesthetic total: 2.5 ml  Irrigation method: syringe Amount of cleaning: standard  Skin closure: 4-0 Ethilon  Number of sutures: 2  Technique: simple interrupted  Patient tolerance: Patient tolerated the procedure well with no immediate complications.   Medications Ordered in ED Medications  lidocaine HCl (PF) (XYLOCAINE) 2 % injection 10 mL (has no administration in time range)  povidone-iodine (BETADINE) 10 % external solution (has no administration in time range)  ceFAZolin (ANCEF) IVPB 1 g/50 mL premix (has no  administration in time range)  oxyCODONE-acetaminophen (PERCOCET/ROXICET) 5-325 MG per tablet 1 tablet (1 tablet Oral Given 02/17/23 1255)  Tdap (BOOSTRIX) injection 0.5 mL (0.5 mLs Intramuscular Given 02/17/23 1256)    ED Course/ Medical Decision Making/ A&P                             Medical Decision Making Patient here with lacerations to left index and middle fingers that occurred while using a table saw.  Last Td unknown.  Macerated lacerations to the lateral aspect of the fingers.  Partial avulsion of the nail to the index finger.  Bleeding controlled prior to arrival.  Neurovascularly intact.  Td updated here, will soak wounds, irrigate and evaluate for possible foreign bodies will try to approximate wound edges  Amount and/or Complexity of Data Reviewed Radiology: ordered.    Details: X-ray of the left hand shows laceration medial to the distal interphalangeal joint of the middle finger with probable comminuted fracture involving the proximal base of the distal  third phalanx Discussion of management or test interpretation with external provider(s): TD updated, wound loosely approximated to control bleeding.  Hemostasis obtained.  Wounds were thoroughly irrigated with saline, no foreign bodies were seen.  Neurovascularly intact. Given IV Ancef here, pain medication.  Tolerated procedure well  Discussed findings with orthopedics, Dr. Romeo Apple who agrees to close outpatient follow-up will see patient in office this week.  Risk OTC drugs. Prescription drug management.           Final Clinical Impression(s) / ED Diagnoses Final diagnoses:  Open fracture of tuft of distal phalanx of finger  Laceration of left index finger without foreign body with damage to nail, initial encounter    Rx / DC Orders ED Discharge Orders     None         Pauline Aus, PA-C 02/17/23 1739    Gloris Manchester, MD 02/18/23 [phone]

## 2023-02-18 ENCOUNTER — Ambulatory Visit: Payer: Managed Care, Other (non HMO) | Admitting: Physician Assistant

## 2023-02-18 DIAGNOSIS — S61211A Laceration without foreign body of left index finger without damage to nail, initial encounter: Secondary | ICD-10-CM | POA: Diagnosis not present

## 2023-02-19 ENCOUNTER — Ambulatory Visit: Payer: Managed Care, Other (non HMO) | Admitting: Orthopaedic Surgery

## 2023-02-21 ENCOUNTER — Ambulatory Visit: Payer: Managed Care, Other (non HMO) | Admitting: Orthopedic Surgery

## 2023-02-26 DIAGNOSIS — S61211A Laceration without foreign body of left index finger without damage to nail, initial encounter: Secondary | ICD-10-CM | POA: Diagnosis not present

## 2023-03-07 ENCOUNTER — Encounter: Payer: No Typology Code available for payment source | Admitting: Physician Assistant

## 2023-03-12 ENCOUNTER — Ambulatory Visit (INDEPENDENT_AMBULATORY_CARE_PROVIDER_SITE_OTHER): Payer: 59 | Admitting: Physician Assistant

## 2023-03-12 ENCOUNTER — Encounter: Payer: Self-pay | Admitting: Physician Assistant

## 2023-03-12 VITALS — BP 125/86 | HR 88 | Ht 67.0 in | Wt 198.0 lb

## 2023-03-12 DIAGNOSIS — E669 Obesity, unspecified: Secondary | ICD-10-CM

## 2023-03-12 DIAGNOSIS — Z1159 Encounter for screening for other viral diseases: Secondary | ICD-10-CM

## 2023-03-12 DIAGNOSIS — Z136 Encounter for screening for cardiovascular disorders: Secondary | ICD-10-CM

## 2023-03-12 DIAGNOSIS — Z114 Encounter for screening for human immunodeficiency virus [HIV]: Secondary | ICD-10-CM | POA: Diagnosis not present

## 2023-03-12 DIAGNOSIS — Z0000 Encounter for general adult medical examination without abnormal findings: Secondary | ICD-10-CM | POA: Diagnosis not present

## 2023-03-12 DIAGNOSIS — E66811 Obesity, class 1: Secondary | ICD-10-CM

## 2023-03-12 DIAGNOSIS — E559 Vitamin D deficiency, unspecified: Secondary | ICD-10-CM | POA: Diagnosis not present

## 2023-03-12 DIAGNOSIS — F411 Generalized anxiety disorder: Secondary | ICD-10-CM

## 2023-03-12 NOTE — Progress Notes (Signed)
Complete physical exam  Patient: [name]   DOB: 27-May-1996   27 y.o. Male  MRN: [id number] Visit Date: 03/12/2023  Today's healthcare provider: Debera Lat, PA-C   Chief Complaint  Patient presents with   Annual Exam   Subjective    [name] is a 27 y.o. male who presents today for a complete physical exam.  He reports consuming a  healthy  diet. The patient does not participate in regular exercise at present. He generally feels fairly well. He reports sleeping fairly well. He does not have additional problems to discuss today.  HPI   Last depression screening scores    03/12/2023    3:30 PM 09/17/2022    1:56 PM 08/19/2022    1:58 PM  PHQ 2/9 Scores  PHQ - 2 Score 0  0  PHQ- 9 Score 0  0     Information is confidential and restricted. Go to Review Flowsheets to unlock data.   Last fall risk screening    03/12/2023    3:30 PM  Fall Risk   Falls in the past year? 0  Number falls in past yr: 0  Injury with Fall? 0   Last Audit-C alcohol use screening    03/12/2023    3:30 PM  Alcohol Use Disorder Test (AUDIT)  1. How often do you have a drink containing alcohol? 1  2. How many drinks containing alcohol do you have on a typical day when you are drinking? 0  3. How often do you have six or more drinks on one occasion? 0  AUDIT-C Score 1   A score of 3 or more in women, and 4 or more in men indicates increased risk for alcohol abuse, EXCEPT if all of the points are from question 1   Past Medical History:  Diagnosis Date   Angioedema 09/02/2016   Depression    History of seizure    Oppositional defiant disorder    Seasonal allergies    Seizures (HCC)    sinle seizure oct/2011   Urticaria    Wears glasses    Past Surgical History:  Procedure Laterality Date   sugery for arm fracture age 62     Social History   Socioeconomic History   Marital status: Married    Spouse name: Not on file   Number of children: Not on file    Years of education: Not on file   Highest education level: Not on file  Occupational History   Not on file  Tobacco Use   Smoking status: Former    Types: Cigars, E-cigarettes    Quit date: 07/2022    Years since quitting: 0.6   Smokeless tobacco: Former    Types: Chew    Quit date: 02/20/2019  Vaping Use   Vaping Use: Former  Substance and Sexual Activity   Alcohol use: Not Currently    Comment: previously 1 beer at dinner or 6-7 drinks very infrequently socially   Drug use: Not Currently    Types: Marijuana    Comment: infrequent previously. Last use 2021   Sexual activity: Yes  Other Topics Concern   Not on file  Social History Narrative   Not on file   Social Determinants of Health   Financial Resource Strain: Not on file  Food Insecurity: Not on file  Transportation Needs: Not on file  Physical Activity: Not on file  Stress: Not on file  Social Connections: Not on file  Intimate Partner Violence: Not on file   Family Status  Relation Name Status   Mother  (Not Specified)   Father  (Not Specified)   Sister  (Not Specified)   MGM  (Not Specified)   PGM  (Not Specified)   Family History  Problem Relation Age of Onset   Allergies Mother    Alcohol abuse Father    Heart attack Father    Cancer Father    Allergies Sister    Breast cancer Maternal Grandmother    Depression Paternal Grandmother    Allergies  Allergen Reactions   Augmentin [Amoxicillin-Pot Clavulanate]     Patient Care Team: Debera Lat, PA-C as PCP - General (Physician Assistant)   Medications: Outpatient Medications Prior to Visit  Medication Sig   busPIRone (BUSPAR) 5 MG tablet Take 1 tablet (5 mg total) by mouth 2 (two) times daily.   Cholecalciferol (VITAMIN D) 125 MCG (5000 UT) CAPS Take 125 mcg by mouth daily.   DULoxetine (CYMBALTA) 60 MG capsule Take 2 capsules (120 mg total) by mouth daily.   [DISCONTINUED] cephALEXin (KEFLEX) 500 MG capsule Take 1 capsule (500 mg total) by  mouth 4 (four) times daily.   [DISCONTINUED] oxyCODONE-acetaminophen (PERCOCET/ROXICET) 5-325 MG tablet Take 1 tablet by mouth every 4 (four) hours as needed.   No facility-administered medications prior to visit.    Review of Systems  All other systems reviewed and are negative.  Except see HPI    Objective    BP 125/86 (BP Location: Right Arm, Patient Position: Sitting, Cuff Size: Normal)   Pulse 88   Ht 5\' 7"  (1.702 m)   Wt 198 lb (89.8 kg)   SpO2 98%   BMI 31.01 kg/m     Physical Exam Vitals and nursing note reviewed.  Constitutional:      General: He is not in acute distress.    Appearance: Normal appearance. He is obese. He is not ill-appearing, toxic-appearing or diaphoretic.  HENT:     Head: Normocephalic and atraumatic.     Right Ear: Tympanic membrane, ear canal and external ear normal.     Left Ear: Tympanic membrane, ear canal and external ear normal.     Nose: Nose normal. No congestion or rhinorrhea.     Mouth/Throat:     Mouth: Mucous membranes are moist.     Pharynx: Oropharynx is clear. No oropharyngeal exudate or posterior oropharyngeal erythema.  Eyes:     General: No scleral icterus.       Right eye: No discharge.        Left eye: No discharge.     Extraocular Movements: Extraocular movements intact.     Conjunctiva/sclera: Conjunctivae normal.     Pupils: Pupils are equal, round, and reactive to light.  Neck:     Vascular: No carotid bruit.  Cardiovascular:     Rate and Rhythm: Normal rate and regular rhythm.     Pulses: Normal pulses.     Heart sounds: Normal heart sounds. No murmur heard.    No friction rub. No gallop.  Pulmonary:     Effort: Pulmonary effort is normal. No respiratory distress.     Breath sounds: Normal breath sounds. No stridor. No wheezing, rhonchi or rales.  Chest:     Chest wall: No tenderness.  Abdominal:     General: Abdomen is flat. Bowel sounds are normal. There is no distension.     Palpations: Abdomen is soft.  There is no mass.  Tenderness: There is no abdominal tenderness. There is no right CVA tenderness, left CVA tenderness, guarding or rebound.     Hernia: No hernia is present.  Musculoskeletal:        General: No swelling, tenderness, deformity or signs of injury. Normal range of motion.     Cervical back: Normal range of motion. No rigidity or tenderness.     Right lower leg: No edema.     Left lower leg: No edema.  Lymphadenopathy:     Cervical: No cervical adenopathy.  Skin:    General: Skin is warm.     Capillary Refill: Capillary refill takes less than 2 seconds.     Coloration: Skin is not jaundiced or pale.     Findings: No bruising, erythema, lesion or rash.  Neurological:     Mental Status: He is alert and oriented to person, place, and time. Mental status is at baseline.     Cranial Nerves: No cranial nerve deficit.     Sensory: No sensory deficit.     Motor: No weakness.     Coordination: Coordination normal.     Gait: Gait normal.     Deep Tendon Reflexes: Reflexes normal.  Psychiatric:        Behavior: Behavior normal.        Thought Content: Thought content normal.        Judgment: Judgment normal.      No results found for any visits on 03/12/23.  Assessment & Plan    Routine Health Maintenance and Physical Exam  Exercise Activities and Dietary recommendations  Goals   None     Immunization History  Administered Date(s) Administered   Hepatitis A 03/04/2013   Hepatitis A, Ped/Adol-2 Dose 09/21/2013   Influenza,inj,Quad PF,6+ Mos 07/29/2022   Meningococcal Conjugate 06/30/2007, 03/04/2013   Moderna Sars-Covid-2 Vaccination 10/20/2019, 11/18/2019, 11/10/2020   Tdap 06/30/2007, 08/19/2022, 02/17/2023   Varicella 04/11/2016    Health Maintenance  Topic Date Due   HIV Screening  Never done   Hepatitis C Screening: USPSTF Recommendation to screen - Ages 23-79 yo.  Never done   COVID-19 Vaccine (4 - 2023-24 season) 06/21/2022   Flu Shot  05/22/2023    DTaP/Tdap/Td vaccine (4 - Td or Tdap) 02/16/2033   HPV Vaccine  Aged Out    Discussed health benefits of physical activity, and encouraged him to engage in regular exercise appropriate for his age and condition.  Needs to do labs: 1. Encounter for special screening examination for cardiovascular disorder - Lipid panel  2. Need for hepatitis C screening test Low risk screening - Hepatitis C antibody  3. Encounter for screening for HIV Low risk screening - HIV Antibody (routine testing w rflx)  4. Annual physical exam  UTD on dental/eye/wears lenses Things to do to keep yourself healthy  - Exercise at least 30-45 minutes a day, 3-4 days a week.  - Eat a low-fat diet with lots of fruits and vegetables, up to 7-9 servings per day.  - Seatbelts can save your life. Wear them always.  - Smoke detectors on every level of your home, check batteries every year.  - Eye Doctor - have an eye exam every 1-2 years  - Safe sex - if you may be exposed to STDs, use a condom.  - Alcohol -  If you drink, do it moderately, less than 2 drinks per day.  - Health Care Power of Attorney. Choose someone to speak for you if you are not able.  -  Depression is common in our stressful world.If you're feeling down or losing interest in things you normally enjoy, please come in for a visit.  - Violence - If anyone is threatening or hurting you, please call immediately.  - CBC with Differential/Platelet - Comprehensive metabolic panel  Needs to do labs: 5. Avitaminosis D In the past , has been taking supplements but requested to check a test due to his concerns about vit d deficiency. - Comprehensive metabolic panel - Vitamin D (25 hydroxy)  6. Generalized anxiety disorder Has been managed by [facility] Was placed on Cymbalta  Improved anxiety, TMJ - CBC with Differential/Platelet - Comprehensive metabolic panel - Vitamin D (25 hydroxy)  7. Obesity (BMI 30.0-34.9) Chronic and stable. Bmie today was  31.01 Advised to adhere to healthy diet and daily exercise - CBC with Differential/Platelet - Comprehensive metabolic panel - Vitamin D (25 hydroxy)  Return in about 1 year (around 03/11/2024) for CPE.    The patient was advised to call back or seek an in-person evaluation if the symptoms worsen or if the condition fails to improve as anticipated.  I discussed the assessment and treatment plan with the patient. The patient was provided an opportunity to ask questions and all were answered. The patient agreed with the plan and demonstrated an understanding of the instructions.  I, Debera Lat, PA-C have reviewed all documentation for this visit. The documentation on  03/12/23  for the exam, diagnosis, procedures, and orders are all accurate and complete.  Debera Lat, [facility], MMS [facility] [phone] (phone) [phone] (fax)   [facility]

## 2023-03-18 ENCOUNTER — Telehealth (HOSPITAL_COMMUNITY): Payer: 59 | Admitting: Psychiatry

## 2023-04-01 ENCOUNTER — Encounter (HOSPITAL_COMMUNITY): Payer: Self-pay | Admitting: Psychiatry

## 2023-04-01 ENCOUNTER — Other Ambulatory Visit: Payer: Self-pay

## 2023-04-01 ENCOUNTER — Telehealth (INDEPENDENT_AMBULATORY_CARE_PROVIDER_SITE_OTHER): Payer: 59 | Admitting: Psychiatry

## 2023-04-01 DIAGNOSIS — F411 Generalized anxiety disorder: Secondary | ICD-10-CM

## 2023-04-01 DIAGNOSIS — E559 Vitamin D deficiency, unspecified: Secondary | ICD-10-CM | POA: Diagnosis not present

## 2023-04-01 DIAGNOSIS — M26609 Unspecified temporomandibular joint disorder, unspecified side: Secondary | ICD-10-CM

## 2023-04-01 MED ORDER — BUSPIRONE HCL 5 MG PO TABS
5.0000 mg | ORAL_TABLET | Freq: Two times a day (BID) | ORAL | 2 refills | Status: AC
Start: 2023-04-01 — End: ?
  Filled 2023-04-01 – 2023-05-02 (×5): qty 60, 30d supply, fill #0
  Filled 2023-05-27: qty 60, 30d supply, fill #1
  Filled 2023-06-26: qty 60, 30d supply, fill #2

## 2023-04-01 MED ORDER — DULOXETINE HCL 30 MG PO CPEP
90.0000 mg | ORAL_CAPSULE | Freq: Every day | ORAL | 2 refills | Status: AC
Start: 2023-04-01 — End: ?
  Filled 2023-04-01: qty 90, 30d supply, fill #0
  Filled 2023-04-25 – 2023-05-02 (×4): qty 90, 30d supply, fill #1
  Filled 2023-06-26: qty 90, 30d supply, fill #2

## 2023-04-01 NOTE — Progress Notes (Signed)
BH MD Outpatient Progress Note  04/01/2023 3:59 PM [name]  MRN:  [id number]  Assessment:  [name] presents for follow-up evaluation. Today, 04/01/23, patient reports doing well today similar to previous visits.  With his anxiety largely being in remission at this point and being able to consistently cut down on caffeine to around 12 ounces most days with occasional 24 ounces he was amenable to taper of Cymbalta to 90 mg once daily today.  He was also able to find a nightguard that is helpful for his TMJ and only when he is more stressed will he have some of the old jaw pain from daytime bruxism.  Follow-up in 3 months due to clinical stability.  For safety, his acute risk factors for suicide are: none. His chronic risk factors are: chronic mental illness, history of aborted suicide attempt, past substance use. His protective factors are: supportive family, employment, minor children living in the home, beloved pets, actively seeking and engaging with mental/physical healthcare, contracting for safety, no SI. While future events cannot be fully predicted he is not currently meeting IVC criteria and can continue as an outpatient.   Identifying Information: [name] is a 27 y.o. male with a history of generalized anxiety disorder, MDD in remission with one lifetime aborted suicide attempt at age 75-15, TMJ, caffeine overuse, history of tobacco use disorder, history of cannabis use disorder in sustained remission, and childhood ODD who is an established patient with [facility] participating in follow-up via video conferencing. Initial evaluation of anxiety on 09/17/2022; see that note for full Case formulation.  Patient reported improving jaw pain since starting cymbalta but ongoing anxious thoughts consistent with generalized anxiety disorder. He did have one lifetime aborted suicide attempt in adolescence and hasn't had SI since in discussion with  patient and depression is still in remission. He was using an excessive amount of caffeine daily at 32-40oz and encouraged him to cut back as this may be a perpetuating reason for his anxiousness. He also recently quit smoking in October 2023. Remote use of cannabis last in 2021. He is not currently seeing psychotherapy and with relatively low amounts of daily anxiety it is reasonable to trial just changes above before establishing with therapy. No worsening of his anxiety or return of his depression with discontinuation of Wellbutrin. PCP did check a vitamin D level and he was found to have a vitamin D level of 16 and is on a 5000 units daily supplement.  Plan:  # Generalized anxiety disorder  Past medication trials: See med trials below Status of problem: Improving Interventions: -- taper cymbalta to 90mg  daily (s1/12/24, d6/11/24) -- continue buspar 5mg  bid for now -- cut back on caffeine use  # TMJ  Caffeine over use Past medication trials:  Status of problem: improving Interventions: -- cut back on caffeine -- ask dentist about night guard fitting  # Vitamin D deficiency Past medication trials:  Status of problem: Improving Interventions: -- Continue vitamin D 5000 units daily supplement  # History of tobacco use disorder in early remission Past medication trials:  Status of problem: In remission Interventions: -- continue to encourage abstinence  # History of cannabis use disorder in sustained remission Past medication trials:  Status of problem: In remission Interventions: -- continue to encourage abstinence  # Depression, in remission  History of 1 lifetime suicide attempt Past medication trials:  Status of problem: In remission Interventions: -- continue to monitor for recurrence  Patient  was given contact information for behavioral health clinic and was instructed to call [phone] for emergencies.   Subjective:  Chief Complaint:  Chief Complaint  Patient  presents with   Anxiety   Temporomandibular Joint Pain   Follow-up    Interval History: Doing well since last appointment. Has been working and raise his toddler which is enjoyable. Found a mouth guard that is working for his teeth grinding at night. Will still have some days where it is sore from daytime clenching. Doesn't identify anything out of the ordinary that is bothering him in terms of stressors. Mainly finances. Coffee mostly down to 12oz per day but some days will be 24oz. He is amenable to trial of cutting back of Cymbalta.  Visit Diagnosis:    ICD-10-CM   1. Vitamin D deficiency  E55.9     2. Generalized anxiety disorder  F41.1 DULoxetine (CYMBALTA) 30 MG capsule    busPIRone (BUSPAR) 5 MG tablet    3. TMJ (temporomandibular joint disorder)  M26.609 DULoxetine (CYMBALTA) 30 MG capsule      Past Psychiatric History:  Diagnoses: generalized anxiety disorder, MDD in remission with one lifetime aborted suicide attempt at age [age], TMJ, caffeine overuse, history of tobacco use disorder, history of cannabis use disorder in sustained remission, and childhood ODD Medication trials: cymbalta, wellbutrin, buspar. Zoloft, prozac, celexa.  Previous psychiatrist/therapist: yes Hospitalizations: yes as below Suicide attempts: mother stopped him at age 55-15 with plan to hang himself in woods behind house SIB: cut in late teens and early 41s on thighs, upper arms, wrist Hx of violence towards others: none Current access to guns: rifles, handgun stored in a gun safe Hx of abuse: none Substance use: No recent marijuana use  Past Medical History:  Past Medical History:  Diagnosis Date   Angioedema 09/02/2016   Depression    History of seizure    Oppositional defiant disorder    Seasonal allergies    Seizures (HCC)    sinle seizure oct/2011   Urticaria    Wears glasses     Past Surgical History:  Procedure Laterality Date   sugery for arm fracture age 83      Family  Psychiatric History: maternal great grandmother with depression   Family History:  Family History  Problem Relation Age of Onset   Allergies Mother    Alcohol abuse Father    Heart attack Father    Cancer Father    Allergies Sister    Breast cancer Maternal Grandmother    Depression Paternal Grandmother     Social History:  Social History   Socioeconomic History   Marital status: Married    Spouse name: Not on file   Number of children: Not on file   Years of education: Not on file   Highest education level: Not on file  Occupational History   Not on file  Tobacco Use   Smoking status: Former    Types: Cigars, E-cigarettes    Quit date: 07/2022    Years since quitting: 0.6   Smokeless tobacco: Former    Types: Chew    Quit date: 02/20/2019  Vaping Use   Vaping Use: Former  Substance and Sexual Activity   Alcohol use: Not Currently    Comment: previously 1 beer at dinner or 6-7 drinks very infrequently socially   Drug use: Not Currently    Types: Marijuana    Comment: infrequent previously. Last use 2021   Sexual activity: Yes  Other Topics Concern  Not on file  Social History Narrative   Not on file   Social Determinants of Health   Financial Resource Strain: Not on file  Food Insecurity: Not on file  Transportation Needs: Not on file  Physical Activity: Not on file  Stress: Not on file  Social Connections: Not on file    Allergies:  Allergies  Allergen Reactions   Augmentin [Amoxicillin-Pot Clavulanate]     Current Medications: Current Outpatient Medications  Medication Sig Dispense Refill   busPIRone (BUSPAR) 5 MG tablet Take 1 tablet (5 mg total) by mouth 2 (two) times daily. 60 tablet 2   Cholecalciferol (VITAMIN D) 125 MCG (5000 UT) CAPS Take 125 mcg by mouth daily.     DULoxetine (CYMBALTA) 30 MG capsule Take 3 capsules (90 mg total) by mouth daily. 90 capsule 2   No current facility-administered medications for this visit.    ROS: Review  of Systems  Musculoskeletal:        TMJ  Psychiatric/Behavioral:  Negative for decreased concentration, dysphoric mood, hallucinations, self-injury, sleep disturbance and suicidal ideas. The patient is not nervous/anxious.     Objective:  Psychiatric Specialty Exam: There were no vitals taken for this visit.There is no height or weight on file to calculate BMI.  General Appearance: Casual, Neat, Well Groomed, and appears stated age  Eye Contact:  Good  Speech:  Clear and Coherent and Normal Rate  Volume:  Normal  Mood:   "Pretty good"  Affect:  Appropriate, Congruent, Constricted, and calm and cooperative  Thought Content: Logical and Hallucinations: None   Suicidal Thoughts:  No  Homicidal Thoughts:  No  Thought Process:  Coherent, Goal Directed, and Linear  Orientation:  Full (Time, Place, and Person)    Memory:  Immediate;   Good  Judgment:  Good  Insight:  Good  Concentration:  Concentration: Good and Attention Span: Good  Recall:  Good  Fund of Knowledge: Good  Language: Good  Psychomotor Activity:  Normal  Akathisia:  No  AIMS (if indicated): not done  Assets:  Communication Skills Desire for Improvement Financial Resources/Insurance Housing Intimacy Leisure Time Physical Health Resilience Social Support Talents/Skills Transportation Vocational/Educational  ADL's:  Intact  Cognition: WNL  Sleep:  Fair   PE: General: sits comfortably in view of camera; no acute distress  Pulm: no increased work of breathing on room air  MSK: all extremity movements appear intact  Neuro: no focal neurological deficits observed  Gait & Station: unable to assess by video    Metabolic Disorder Labs: No results found for: "HGBA1C", "MPG" No results found for: "PROLACTIN" Lab Results  Component Value Date   CHOL 160 03/28/2017   TRIG 66 03/28/2017   HDL 45 03/28/2017   CHOLHDL 3.6 03/28/2017   LDLCALC 102 (H) 03/28/2017   Lab Results  Component Value Date   TSH 2.260  10/25/2022   TSH 1.630 03/28/2017    Therapeutic Level Labs: No results found for: "LITHIUM" No results found for: "VALPROATE" No results found for: "CBMZ"  Screenings:  GAD-7    Flowsheet Row Office Visit from 01/11/2020 in [facility]  Total GAD-7 Score 0      PHQ2-9    Flowsheet Row Office Visit from 03/12/2023 in [facility] Office Visit from 09/17/2022 in [facility] [place] Office Visit from 08/19/2022 in [facility] Office Visit from 11/22/2020 in [facility] Office Visit from 01/11/2020 in [facility]  PHQ-2 Total Score 0 0 0 0 0  PHQ-9 Total Score 0 -- 0 3 --      Flowsheet Row ED from 02/17/2023 in [facility] at [facility] Office Visit from 09/17/2022 in [facility] [place]  C-SSRS RISK CATEGORY No Risk No Risk       Collaboration of Care: Collaboration of Care: Medication Management AEB as above  Patient/Guardian was advised Release of Information must be obtained prior to any record release in order to collaborate their care with an outside provider. Patient/Guardian was advised if they have not already done so to contact the registration department to sign all necessary forms in order for [place] to release information regarding their care.   Consent: Patient/Guardian gives verbal consent for treatment and assignment of benefits for services provided during this visit. Patient/Guardian expressed understanding and agreed to proceed.   Televisit via video: I connected with Yugan on 04/01/23 at  3:30 PM EDT by a video enabled telemedicine application and verified that I am speaking with the correct person using two identifiers.  Location: Patient: Home Provider: Home office   I discussed the limitations of evaluation and management by telemedicine and the availability of in  person appointments. The patient expressed understanding and agreed to proceed.  I discussed the assessment and treatment plan with the patient. The patient was provided an opportunity to ask questions and all were answered. The patient agreed with the plan and demonstrated an understanding of the instructions.   The patient was advised to call back or seek an in-person evaluation if the symptoms worsen or if the condition fails to improve as anticipated.  I provided 15 minutes of non-face-to-face time during this encounter.  Elsie Lincoln, MD 04/01/2023, 3:59 PM

## 2023-04-01 NOTE — Patient Instructions (Signed)
We decreased the Cymbalta to 90 mg once daily today as your anxiety also appears to largely be in remission at this point.  Keep up the good work with cutting back on caffeine.

## 2023-04-18 DIAGNOSIS — Z0000 Encounter for general adult medical examination without abnormal findings: Secondary | ICD-10-CM | POA: Diagnosis not present

## 2023-04-18 DIAGNOSIS — F411 Generalized anxiety disorder: Secondary | ICD-10-CM | POA: Diagnosis not present

## 2023-04-18 DIAGNOSIS — E559 Vitamin D deficiency, unspecified: Secondary | ICD-10-CM | POA: Diagnosis not present

## 2023-04-18 DIAGNOSIS — E669 Obesity, unspecified: Secondary | ICD-10-CM | POA: Diagnosis not present

## 2023-04-19 LAB — COMPREHENSIVE METABOLIC PANEL
Albumin: 4.7 g/dL (ref 4.3–5.2)
Alkaline Phosphatase: 74 IU/L (ref 44–121)
BUN/Creatinine Ratio: 15 (ref 9–20)
Bilirubin Total: 0.4 mg/dL (ref 0.0–1.2)
Calcium: 9.5 mg/dL (ref 8.7–10.2)
Glucose: 92 mg/dL (ref 70–99)

## 2023-04-19 LAB — FSH, (3 SPECIMENS)

## 2023-04-19 LAB — CBC WITH DIFFERENTIAL/PLATELET
Hematocrit: 47.2 % (ref 37.5–51.0)
Hemoglobin: 15.9 g/dL (ref 13.0–17.7)
Lymphocytes Absolute: 2.3 10*3/uL (ref 0.7–3.1)
MCV: 90 fL (ref 79–97)
Neutrophils: 52 %
Platelets: 355 10*3/uL (ref 150–450)
RDW: 12.6 % (ref 11.6–15.4)

## 2023-04-19 LAB — LIPID PANEL
Chol/HDL Ratio: 6.4 ratio — ABNORMAL HIGH (ref 0.0–5.0)
Cholesterol, Total: 206 mg/dL — ABNORMAL HIGH (ref 100–199)
Triglycerides: 186 mg/dL — ABNORMAL HIGH (ref 0–149)

## 2023-04-19 LAB — VITAMIN D 25 HYDROXY (VIT D DEFICIENCY, FRACTURES): Vit D, 25-Hydroxy: 56.7 ng/mL (ref 30.0–100.0)

## 2023-04-23 LAB — COMPREHENSIVE METABOLIC PANEL
ALT: 30 IU/L (ref 0–44)
AST: 28 IU/L (ref 0–40)
BUN: 16 mg/dL (ref 6–20)
CO2: 23 mmol/L (ref 20–29)
Chloride: 103 mmol/L (ref 96–106)
Creatinine, Ser: 1.04 mg/dL (ref 0.76–1.27)
Globulin, Total: 2.6 g/dL (ref 1.5–4.5)
Potassium: 4.5 mmol/L (ref 3.5–5.2)
Sodium: 140 mmol/L (ref 134–144)
Total Protein: 7.3 g/dL (ref 6.0–8.5)
eGFR: 100 mL/min/{1.73_m2} (ref >59)

## 2023-04-23 LAB — CBC WITH DIFFERENTIAL/PLATELET
Basophils Absolute: 0.1 10*3/uL (ref 0.0–0.2)
Basos: 1 %
EOS (ABSOLUTE): 0.3 10*3/uL (ref 0.0–0.4)
Eos: 4 %
Immature Grans (Abs): 0 10*3/uL (ref 0.0–0.1)
Immature Granulocytes: 0 %
Lymphs: 37 %
MCH: 30.3 pg (ref 26.6–33.0)
MCHC: 33.7 g/dL (ref 31.5–35.7)
Monocytes Absolute: 0.4 10*3/uL (ref 0.1–0.9)
Monocytes: 6 %
Neutrophils Absolute: 3.3 10*3/uL (ref 1.4–7.0)
RBC: 5.25 x10E6/uL (ref 4.14–5.80)
WBC: 6.4 10*3/uL (ref 3.4–10.8)

## 2023-04-23 LAB — LIPID PANEL
HDL: 32 mg/dL — ABNORMAL LOW (ref >39)
LDL Chol Calc (NIH): 140 mg/dL — ABNORMAL HIGH (ref 0–99)
VLDL Cholesterol Cal: 34 mg/dL (ref 5–40)

## 2023-04-23 LAB — HEPATITIS C ANTIBODY: Hep C Virus Ab: NONREACTIVE

## 2023-04-25 ENCOUNTER — Other Ambulatory Visit: Payer: Self-pay

## 2023-04-25 ENCOUNTER — Other Ambulatory Visit (HOSPITAL_BASED_OUTPATIENT_CLINIC_OR_DEPARTMENT_OTHER): Payer: Self-pay

## 2023-04-30 ENCOUNTER — Other Ambulatory Visit: Payer: Self-pay

## 2023-05-02 ENCOUNTER — Other Ambulatory Visit: Payer: Self-pay

## 2023-05-02 ENCOUNTER — Other Ambulatory Visit (HOSPITAL_COMMUNITY): Payer: Self-pay

## 2023-06-01 ENCOUNTER — Encounter: Payer: Self-pay | Admitting: Physician Assistant

## 2023-06-26 ENCOUNTER — Other Ambulatory Visit: Payer: Self-pay | Admitting: Oncology

## 2023-06-26 DIAGNOSIS — Z006 Encounter for examination for normal comparison and control in clinical research program: Secondary | ICD-10-CM

## 2023-07-03 ENCOUNTER — Telehealth (HOSPITAL_COMMUNITY): Payer: 59 | Admitting: Psychiatry

## 2023-07-04 ENCOUNTER — Telehealth (HOSPITAL_COMMUNITY): Payer: 59 | Admitting: Psychiatry

## 2023-07-21 ENCOUNTER — Encounter (HOSPITAL_COMMUNITY): Payer: Self-pay | Admitting: Psychiatry

## 2023-07-21 ENCOUNTER — Telehealth (INDEPENDENT_AMBULATORY_CARE_PROVIDER_SITE_OTHER): Payer: 59 | Admitting: Psychiatry

## 2023-07-21 ENCOUNTER — Other Ambulatory Visit: Payer: Self-pay

## 2023-07-21 DIAGNOSIS — M26609 Unspecified temporomandibular joint disorder, unspecified side: Secondary | ICD-10-CM

## 2023-07-21 DIAGNOSIS — F411 Generalized anxiety disorder: Secondary | ICD-10-CM

## 2023-07-21 MED ORDER — BUSPIRONE HCL 5 MG PO TABS
5.0000 mg | ORAL_TABLET | Freq: Two times a day (BID) | ORAL | 2 refills | Status: DC
Start: 2023-07-21 — End: 2023-09-22
  Filled 2023-07-21: qty 60, 30d supply, fill #0
  Filled 2023-08-25: qty 60, 30d supply, fill #1
  Filled 2023-09-22: qty 60, 30d supply, fill #2

## 2023-07-21 MED ORDER — DULOXETINE HCL 60 MG PO CPEP
60.0000 mg | ORAL_CAPSULE | Freq: Every day | ORAL | 2 refills | Status: DC
Start: 2023-07-21 — End: 2023-08-19
  Filled 2023-07-21: qty 30, 30d supply, fill #0

## 2023-07-21 NOTE — Progress Notes (Signed)
BH MD Outpatient Progress Note  07/21/2023 8:25 AM [name]  MRN:  [id number]  Assessment:  [name] presents for follow-up evaluation. Today, 07/21/23, patient reports doing overall okay considering several very stressful life experiences.  Is dealing with marital strain since July and father's cancer diagnosis since 1 and.  Despite taper of Cymbalta was able to handle both of these things quite well and was amenable to further taper of the Cymbalta today.  Caffeine down to around 12-24 ounces most days likely providing secondary benefit to his anxiety and TMJ.  Follow-up in 1 month.  For safety, his acute risk factors for suicide are: none. His chronic risk factors are: chronic mental illness, history of aborted suicide attempt, past substance use. His protective factors are: supportive family, employment, minor children living in the home, beloved pets, actively seeking and engaging with mental/physical healthcare, contracting for safety, no SI. While future events cannot be fully predicted he is not currently meeting IVC criteria and can continue as an outpatient.   Identifying Information: [name] is a 27 y.o. male with a history of generalized anxiety disorder, MDD in remission with one lifetime aborted suicide attempt at age 48-15, TMJ, caffeine overuse, history of tobacco use disorder, history of cannabis use disorder in sustained remission, and childhood ODD who is an established patient with [facility] participating in follow-up via video conferencing. Initial evaluation of anxiety on 09/17/2022; see that note for full Case formulation.  Patient reported improving jaw pain since starting cymbalta but ongoing anxious thoughts consistent with generalized anxiety disorder. He did have one lifetime aborted suicide attempt in adolescence and hasn't had SI since in discussion with patient and depression is still in remission. He was using an  excessive amount of caffeine daily at 32-40oz and encouraged him to cut back as this may be a perpetuating reason for his anxiousness. He also recently quit smoking in October 2023. Remote use of cannabis last in 2021. He is not currently seeing psychotherapy and with relatively low amounts of daily anxiety it is reasonable to trial just changes above before establishing with therapy. No worsening of his anxiety or return of his depression with discontinuation of Wellbutrin. PCP did check a vitamin D level and he was found to have a vitamin D level of 16 and is on a 5000 units daily supplement. He was also able to find a nightguard that is helpful for his TMJ and only when he is more stressed will he have some of the old jaw pain from daytime bruxism.  Plan:  # Generalized anxiety disorder  Past medication trials: See med trials below Status of problem: Improving Interventions: -- taper cymbalta to 60mg  daily (s1/12/24, d6/11/24, d9/30/24) -- continue buspar 5mg  bid for now -- Continue lower caffeine use  # TMJ  Caffeine over use Past medication trials:  Status of problem: improving Interventions: -- cut back on caffeine -- Continue bite guard  # Vitamin D deficiency Past medication trials:  Status of problem: Improving Interventions: -- Continue vitamin D 5000 units daily supplement  # History of tobacco use disorder in early remission Past medication trials:  Status of problem: In remission Interventions: -- continue to encourage abstinence  # History of cannabis use disorder in sustained remission Past medication trials:  Status of problem: In remission Interventions: -- continue to encourage abstinence  # Depression, in remission  History of 1 lifetime suicide attempt Past medication trials:  Status of problem: In remission  Interventions: -- continue to monitor for recurrence  Patient was given contact information for behavioral health clinic and was instructed to  call 911 for emergencies.   Subjective:  Chief Complaint:  Chief Complaint  Patient presents with   Anxiety   Stress   Follow-up    Interval History: Has been a wild ride since last appointment. His father had cancer in June in his sinus. Went to [place] last month to help him get through the radiation therapy. In July found out about spouse's infidelity earlier in the year. They continue to live together but is giving it until the end of the year to see if it all works out. Thinks he is doing ok considering. Is seeing a therapist through [facility] and has been going well. Thinks medication doses are still effective at their current level but would like to continue taper to 60mg . Similarly, hasn't had any worsening of side effects or symptoms with the lower dose of cymbalta. Jaw has been doing really well outside of a handful of episodes of soreness. Caffeine still 24oz daily, somedays an extra cup but others will be one.  Visit Diagnosis:    ICD-10-CM   1. Generalized anxiety disorder  F41.1 DULoxetine (CYMBALTA) 60 MG capsule    busPIRone (BUSPAR) 5 MG tablet    2. TMJ (temporomandibular joint disorder)  M26.609 DULoxetine (CYMBALTA) 60 MG capsule       Past Psychiatric History:  Diagnoses: generalized anxiety disorder, MDD in remission with one lifetime aborted suicide attempt at age 30-15, TMJ, caffeine overuse, history of tobacco use disorder, history of cannabis use disorder in sustained remission, and childhood ODD Medication trials: cymbalta, wellbutrin, buspar. Zoloft, prozac, celexa.  Previous psychiatrist/therapist: yes Hospitalizations: yes as below Suicide attempts: mother stopped him at age 66-15 with plan to hang himself in woods behind house SIB: cut in late teens and early 57s on thighs, upper arms, wrist Hx of violence towards others: none Current access to guns: rifles, handgun stored in a gun safe Hx of abuse: none Substance use: No recent marijuana use  Past  Medical History:  Past Medical History:  Diagnosis Date   Angioedema 09/02/2016   Caffeine overuse 09/17/2022   Depression    History of seizure    Oppositional defiant disorder    Seasonal allergies    Seizures (HCC)    sinle seizure oct/2011   Urticaria    Wears glasses     Past Surgical History:  Procedure Laterality Date   sugery for arm fracture age 78      Family Psychiatric History: maternal great grandmother with depression   Family History:  Family History  Problem Relation Age of Onset   Allergies Mother    Alcohol abuse Father    Heart attack Father    Cancer Father    Allergies Sister    Breast cancer Maternal Grandmother    Depression Paternal Grandmother     Social History:  Social History   Socioeconomic History   Marital status: Married    Spouse name: Not on file   Number of children: Not on file   Years of education: Not on file   Highest education level: Not on file  Occupational History   Not on file  Tobacco Use   Smoking status: Former    Types: Cigars, E-cigarettes    Quit date: 07/2022    Years since quitting: 1.0   Smokeless tobacco: Former    Types: Chew    Quit date: 02/20/2019  Vaping Use   Vaping status: Former  Substance and Sexual Activity   Alcohol use: Not Currently    Comment: previously 1 beer at dinner or 6-7 drinks very infrequently socially   Drug use: Not Currently    Types: Marijuana    Comment: infrequent previously. Last use 2021   Sexual activity: Yes  Other Topics Concern   Not on file  Social History Narrative   Not on file   Social Determinants of Health   Financial Resource Strain: Not on file  Food Insecurity: Not on file  Transportation Needs: Not on file  Physical Activity: Not on file  Stress: Not on file  Social Connections: Not on file    Allergies:  Allergies  Allergen Reactions   Augmentin [Amoxicillin-Pot Clavulanate]     Current Medications: Current Outpatient Medications   Medication Sig Dispense Refill   busPIRone (BUSPAR) 5 MG tablet Take 1 tablet (5 mg total) by mouth 2 (two) times daily. 60 tablet 2   Cholecalciferol (VITAMIN D) 125 MCG (5000 UT) CAPS Take 125 mcg by mouth daily.     DULoxetine (CYMBALTA) 60 MG capsule Take 1 capsule (60 mg total) by mouth daily. 30 capsule 2   No current facility-administered medications for this visit.    ROS: Review of Systems  Musculoskeletal:        TMJ  Psychiatric/Behavioral:  Negative for decreased concentration, dysphoric mood, hallucinations, self-injury, sleep disturbance and suicidal ideas. The patient is not nervous/anxious.     Objective:  Psychiatric Specialty Exam: There were no vitals taken for this visit.There is no height or weight on file to calculate BMI.  General Appearance: Casual, Neat, Well Groomed, and appears stated age  Eye Contact:  Good  Speech:  Clear and Coherent and Normal Rate  Volume:  Normal  Mood:   "Doing okay considering  Affect:  Appropriate, Congruent, Constricted, and calm and cooperative  Thought Content: Logical and Hallucinations: None   Suicidal Thoughts:  No  Homicidal Thoughts:  No  Thought Process:  Coherent, Goal Directed, and Linear  Orientation:  Full (Time, Place, and Person)    Memory:  Immediate;   Good  Judgment:  Good  Insight:  Good  Concentration:  Concentration: Good and Attention Span: Good  Recall:  Good  Fund of Knowledge: Good  Language: Good  Psychomotor Activity:  Normal  Akathisia:  No  AIMS (if indicated): not done  Assets:  Communication Skills Desire for Improvement Financial Resources/Insurance Housing Intimacy Leisure Time Physical Health Resilience Social Support Talents/Skills Transportation Vocational/Educational  ADL's:  Intact  Cognition: WNL  Sleep:  Fair   PE: General: sits comfortably in view of camera; no acute distress  Pulm: no increased work of breathing on room air  MSK: all extremity movements appear  intact  Neuro: no focal neurological deficits observed  Gait & Station: unable to assess by video    Metabolic Disorder Labs: No results found for: "HGBA1C", "MPG" No results found for: "PROLACTIN" Lab Results  Component Value Date   CHOL 206 (H) 04/18/2023   TRIG 186 (H) 04/18/2023   HDL 32 (L) 04/18/2023   CHOLHDL 6.4 (H) 04/18/2023   LDLCALC 140 (H) 04/18/2023   LDLCALC 102 (H) 03/28/2017   Lab Results  Component Value Date   TSH 2.260 10/25/2022   TSH 1.630 03/28/2017    Therapeutic Level Labs: No results found for: "LITHIUM" No results found for: "VALPROATE" No results found for: "CBMZ"  Screenings:  GAD-7  Flowsheet Row Office Visit from 01/11/2020 in [facility]  Total GAD-7 Score 0      PHQ2-9    Flowsheet Row Office Visit from 03/12/2023 in [facility] Office Visit from 09/17/2022 in [facility] Office Visit from 08/19/2022 in [facility] Office Visit from 11/22/2020 in [facility] Office Visit from 01/11/2020 in [facility]  PHQ-2 Total Score 0 0 0 0 0  PHQ-9 Total Score 0 -- 0 3 --      Flowsheet Row ED from 02/17/2023 in [facility] Office Visit from 09/17/2022 in [facility] [place]  C-SSRS RISK CATEGORY No Risk No Risk       Collaboration of Care: Collaboration of Care: Medication Management AEB as above  Patient/Guardian was advised Release of Information must be obtained prior to any record release in order to collaborate their care with an outside provider. Patient/Guardian was advised if they have not already done so to contact the registration department to sign all necessary forms in order for [place] to release information regarding their care.   Consent: Patient/Guardian gives verbal consent for treatment and assignment of benefits  for services provided during this visit. Patient/Guardian expressed understanding and agreed to proceed.   Televisit via video: I connected with Bohden on 07/21/23 at  8:00 AM EDT by a video enabled telemedicine application and verified that I am speaking with the correct person using two identifiers.  Location: Patient: Home Provider: Home office   I discussed the limitations of evaluation and management by telemedicine and the availability of in person appointments. The patient expressed understanding and agreed to proceed.  I discussed the assessment and treatment plan with the patient. The patient was provided an opportunity to ask questions and all were answered. The patient agreed with the plan and demonstrated an understanding of the instructions.   The patient was advised to call back or seek an in-person evaluation if the symptoms worsen or if the condition fails to improve as anticipated.  I provided 15 minutes of virtual face-to-face time during this encounter.  Elsie Lincoln, MD 07/21/2023, 8:25 AM

## 2023-07-21 NOTE — Patient Instructions (Signed)
We continued to taper of Cymbalta (duloxetine) to 60 mg once daily today.  If after a week feeling your symptoms are becoming unmanageable again please let me know and we can increase the dose back to 90.  I am sorry you have been going through the stressors that you have.  If you are looking for any kind of marriage counseling the Justine Null method has the most research behind it and there are books and flash cards you can get online; you can also type and [facility] [place] therapists and find 1 that is in network.

## 2023-08-11 ENCOUNTER — Encounter: Payer: Self-pay | Admitting: Physician Assistant

## 2023-08-11 DIAGNOSIS — K1379 Other lesions of oral mucosa: Secondary | ICD-10-CM

## 2023-08-19 ENCOUNTER — Encounter (HOSPITAL_COMMUNITY): Payer: Self-pay | Admitting: Psychiatry

## 2023-08-19 ENCOUNTER — Telehealth (HOSPITAL_COMMUNITY): Payer: 59 | Admitting: Psychiatry

## 2023-08-19 ENCOUNTER — Other Ambulatory Visit: Payer: Self-pay

## 2023-08-19 DIAGNOSIS — F411 Generalized anxiety disorder: Secondary | ICD-10-CM | POA: Diagnosis not present

## 2023-08-19 DIAGNOSIS — M26609 Unspecified temporomandibular joint disorder, unspecified side: Secondary | ICD-10-CM

## 2023-08-19 MED ORDER — DULOXETINE HCL 30 MG PO CPEP
90.0000 mg | ORAL_CAPSULE | Freq: Every day | ORAL | 2 refills | Status: DC
Start: 2023-08-19 — End: 2023-09-22
  Filled 2023-08-19: qty 90, 30d supply, fill #0
  Filled 2023-09-22: qty 90, 30d supply, fill #1

## 2023-08-19 NOTE — Progress Notes (Signed)
BH MD Outpatient Progress Note  08/19/2023 8:22 AM [name]  MRN:  [id number]  Assessment:  [name] presents for follow-up evaluation. Today, 08/19/23, patient reports unfortunately worsening anxiety with dissociative symptoms in the setting of decreasing Cymbalta last appointment.  In doing ongoing work with therapy around events from the summer but denies active fights at home.  He was amenable to retitration today and may look to spring before decreasing Cymbalta again in case there is a seasonal component to his mood symptoms.  Caffeine down to around 12-24 ounces most days likely providing secondary benefit to his anxiety and TMJ.  Follow-up in 1 month.  For safety, his acute risk factors for suicide are: none. His chronic risk factors are: chronic mental illness, history of aborted suicide attempt, past substance use. His protective factors are: supportive family, employment, minor children living in the home, beloved pets, actively seeking and engaging with mental/physical healthcare, contracting for safety, no SI. While future events cannot be fully predicted he is not currently meeting IVC criteria and can continue as an outpatient.   Identifying Information: [name] is a 27 y.o. male with a history of generalized anxiety disorder, MDD in remission with one lifetime aborted suicide attempt at age 48-15, TMJ, caffeine overuse, history of tobacco use disorder, history of cannabis use disorder in sustained remission, and childhood ODD who is an established patient with [facility] participating in follow-up via video conferencing. Initial evaluation of anxiety on 09/17/2022; see that note for full Case formulation.  Patient reported improving jaw pain since starting cymbalta but ongoing anxious thoughts consistent with generalized anxiety disorder. He did have one lifetime aborted suicide attempt in adolescence and hasn't had SI since in  discussion with patient and depression is still in remission. He was using an excessive amount of caffeine daily at 32-40oz and encouraged him to cut back as this may be a perpetuating reason for his anxiousness. He also recently quit smoking in October 2023. Remote use of cannabis last in 2021. He is not currently seeing psychotherapy and with relatively low amounts of daily anxiety it is reasonable to trial just changes above before establishing with therapy. No worsening of his anxiety or return of his depression with discontinuation of Wellbutrin. PCP did check a vitamin D level and he was found to have a vitamin D level of 16 and is on a 5000 units daily supplement. He was also able to find a nightguard that is helpful for his TMJ and only when he is more stressed will he have some of the old jaw pain from daytime bruxism.  Plan:  # Generalized anxiety disorder  Past medication trials: See med trials below Status of problem: Worsening Interventions: -- titrate cymbalta to 90mg  daily (s1/12/24, d6/11/24, d9/30/24) -- continue buspar 5mg  bid for now -- Continue lower caffeine use  # TMJ  Caffeine over use Past medication trials:  Status of problem: Worsening Interventions: -- cut back on caffeine -- Continue bite guard  # Vitamin D deficiency Past medication trials:  Status of problem: Improving Interventions: -- Continue vitamin D 5000 units daily supplement  # History of tobacco use disorder in early remission Past medication trials:  Status of problem: In remission Interventions: -- continue to encourage abstinence  # History of cannabis use disorder in sustained remission Past medication trials:  Status of problem: In remission Interventions: -- continue to encourage abstinence  # Depression, in remission  History of 1 lifetime suicide  attempt Past medication trials:  Status of problem: In remission Interventions: -- continue to monitor for recurrence  Patient was  given contact information for behavioral health clinic and was instructed to call 911 for emergencies.   Subjective:  Chief Complaint:  Chief Complaint  Patient presents with   Anxiety   Follow-up   Stress    Interval History: Doing alright today. The last 2-3 weeks have been terrible due to non-stop anxiety and dissociation. Does coincide with lower dose of cymbalta. Denies any new stressors but is still working through the events from July in therapy. Denies fights at home and work stress has been about the same. Dad seems to be doing alright post radiation. Still seeing a therapist through [employer] and has been going well. Jaw has been more noticeable with soreness but still better than previous baseline. Caffeine still 24oz daily, somedays an extra cup but others will be one.  Visit Diagnosis:    ICD-10-CM   1. Generalized anxiety disorder  F41.1 DULoxetine (CYMBALTA) 30 MG capsule    2. TMJ (temporomandibular joint disorder)  M26.609 DULoxetine (CYMBALTA) 30 MG capsule        Past Psychiatric History:  Diagnoses: generalized anxiety disorder, MDD in remission with one lifetime aborted suicide attempt at age 28-15, TMJ, caffeine overuse, history of tobacco use disorder, history of cannabis use disorder in sustained remission, and childhood ODD Medication trials: cymbalta, wellbutrin, buspar. Zoloft, prozac, celexa.  Previous psychiatrist/therapist: yes Hospitalizations: yes as below Suicide attempts: mother stopped him at age 75-15 with plan to hang himself in woods behind house SIB: cut in late teens and early 76s on thighs, upper arms, wrist Hx of violence towards others: none Current access to guns: rifles, handgun stored in a gun safe Hx of abuse: none Substance use: No recent marijuana use  Past Medical History:  Past Medical History:  Diagnosis Date   Angioedema 09/02/2016   Caffeine overuse 09/17/2022   Depression    History of seizure    Oppositional defiant  disorder    Seasonal allergies    Seizures (HCC)    sinle seizure oct/2011   Urticaria    Wears glasses     Past Surgical History:  Procedure Laterality Date   sugery for arm fracture age 43      Family Psychiatric History: maternal great grandmother with depression   Family History:  Family History  Problem Relation Age of Onset   Allergies Mother    Alcohol abuse Father    Heart attack Father    Cancer Father    Allergies Sister    Breast cancer Maternal Grandmother    Depression Paternal Grandmother     Social History:  Social History   Socioeconomic History   Marital status: Married    Spouse name: Not on file   Number of children: Not on file   Years of education: Not on file   Highest education level: Not on file  Occupational History   Not on file  Tobacco Use   Smoking status: Former    Types: Cigars, E-cigarettes    Quit date: 07/2022    Years since quitting: 1.0   Smokeless tobacco: Former    Types: Chew    Quit date: 02/20/2019  Vaping Use   Vaping status: Former  Substance and Sexual Activity   Alcohol use: Not Currently    Comment: previously 1 beer at dinner or 6-7 drinks very infrequently socially   Drug use: Not Currently  Types: Marijuana    Comment: infrequent previously. Last use 2021   Sexual activity: Yes  Other Topics Concern   Not on file  Social History Narrative   Not on file   Social Determinants of Health   Financial Resource Strain: Not on file  Food Insecurity: Not on file  Transportation Needs: Not on file  Physical Activity: Not on file  Stress: Not on file  Social Connections: Not on file    Allergies:  Allergies  Allergen Reactions   Augmentin [Amoxicillin-Pot Clavulanate]     Current Medications: Current Outpatient Medications  Medication Sig Dispense Refill   busPIRone (BUSPAR) 5 MG tablet Take 1 tablet (5 mg total) by mouth 2 (two) times daily. 60 tablet 2   Cholecalciferol (VITAMIN D) 125 MCG (5000 UT)  CAPS Take 125 mcg by mouth daily.     DULoxetine (CYMBALTA) 30 MG capsule Take 3 capsules (90 mg total) by mouth daily. 90 capsule 2   No current facility-administered medications for this visit.    ROS: Review of Systems  Musculoskeletal:        TMJ  Psychiatric/Behavioral:  Positive for decreased concentration. Negative for dysphoric mood, hallucinations, self-injury, sleep disturbance and suicidal ideas. The patient is nervous/anxious.     Objective:  Psychiatric Specialty Exam: There were no vitals taken for this visit.There is no height or weight on file to calculate BMI.  General Appearance: Casual, Neat, Well Groomed, and appears stated age  Eye Contact:  Good  Speech:  Clear and Coherent and Normal Rate  Volume:  Normal  Mood:   "Terrible the last 2 to 3 weeks"  Affect:  Appropriate, Congruent, Constricted, and  cooperative  Thought Content: Logical and Hallucinations: None   Suicidal Thoughts:  No  Homicidal Thoughts:  No  Thought Process:  Coherent, Goal Directed, and Linear  Orientation:  Full (Time, Place, and Person)    Memory:  Immediate;   Good  Judgment:  Good  Insight:  Good  Concentration:  Concentration: Good and Attention Span: Good  Recall:  Good  Fund of Knowledge: Good  Language: Good  Psychomotor Activity:  Normal  Akathisia:  No  AIMS (if indicated): not done  Assets:  Communication Skills Desire for Improvement Financial Resources/Insurance Housing Intimacy Leisure Time Physical Health Resilience Social Support Talents/Skills Transportation Vocational/Educational  ADL's:  Intact  Cognition: WNL  Sleep:  Fair   PE: General: sits comfortably in view of camera; no acute distress  Pulm: no increased work of breathing on room air  MSK: all extremity movements appear intact  Neuro: no focal neurological deficits observed  Gait & Station: unable to assess by video    Metabolic Disorder Labs: No results found for: "HGBA1C", "MPG" No  results found for: "PROLACTIN" Lab Results  Component Value Date   CHOL 206 (H) 04/18/2023   TRIG 186 (H) 04/18/2023   HDL 32 (L) 04/18/2023   CHOLHDL 6.4 (H) 04/18/2023   LDLCALC 140 (H) 04/18/2023   LDLCALC 102 (H) 03/28/2017   Lab Results  Component Value Date   TSH 2.260 10/25/2022   TSH 1.630 03/28/2017    Therapeutic Level Labs: No results found for: "LITHIUM" No results found for: "VALPROATE" No results found for: "CBMZ"  Screenings:  GAD-7    Flowsheet Row Office Visit from 01/11/2020 in [facility]  Total GAD-7 Score 0      PHQ2-9    Flowsheet Row Office Visit from 03/12/2023 in [facility] Office Visit  from 09/17/2022 in [facility] [place] Office Visit from 08/19/2022 in [facility] Office Visit from 11/22/2020 in [facility] Office Visit from 01/11/2020 in [facility]  PHQ-2 Total Score 0 0 0 0 0  PHQ-9 Total Score 0 -- 0 3 --      Flowsheet Row ED from 02/17/2023 in [facility] at [facility] Office Visit from 09/17/2022 in [facility] at [place]  C-SSRS RISK CATEGORY No Risk No Risk       Collaboration of Care: Collaboration of Care: Medication Management AEB as above  Patient/Guardian was advised Release of Information must be obtained prior to any record release in order to collaborate their care with an outside provider. Patient/Guardian was advised if they have not already done so to contact the registration department to sign all necessary forms in order for [place] to release information regarding their care.   Consent: Patient/Guardian gives verbal consent for treatment and assignment of benefits for services provided during this visit. Patient/Guardian expressed understanding and agreed to proceed.   Televisit via video: I connected with March on 08/19/23 at   8:00 AM EDT by a video enabled telemedicine application and verified that I am speaking with the correct person using two identifiers.  Location: Patient: Home Provider: Home office   I discussed the limitations of evaluation and management by telemedicine and the availability of in person appointments. The patient expressed understanding and agreed to proceed.  I discussed the assessment and treatment plan with the patient. The patient was provided an opportunity to ask questions and all were answered. The patient agreed with the plan and demonstrated an understanding of the instructions.   The patient was advised to call back or seek an in-person evaluation if the symptoms worsen or if the condition fails to improve as anticipated.  I provided 15 minutes of virtual face-to-face time during this encounter.  Elsie Lincoln, MD 08/19/2023, 8:22 AM

## 2023-08-19 NOTE — Patient Instructions (Signed)
We increased the Cymbalta (duloxetine) back to 90 mg once daily today.  We will plan on trying to decrease again once spring arrives but I would also check in with your pharmacist to make sure that the patch of Cymbalta that you have was not affected by the recall.

## 2023-08-25 ENCOUNTER — Other Ambulatory Visit: Payer: Self-pay

## 2023-08-28 ENCOUNTER — Other Ambulatory Visit: Payer: Self-pay

## 2023-08-28 ENCOUNTER — Encounter: Payer: Self-pay | Admitting: Physician Assistant

## 2023-08-28 ENCOUNTER — Ambulatory Visit (INDEPENDENT_AMBULATORY_CARE_PROVIDER_SITE_OTHER): Payer: 59 | Admitting: Physician Assistant

## 2023-08-28 VITALS — BP 118/78 | HR 89 | Temp 98.1°F | Resp 16 | Ht 67.0 in | Wt 191.9 lb

## 2023-08-28 DIAGNOSIS — K1379 Other lesions of oral mucosa: Secondary | ICD-10-CM

## 2023-08-28 MED ORDER — NYSTATIN 100000 UNIT/ML MT SUSP: 5.0000 mL | Freq: Four times a day (QID) | OROMUCOSAL | 0 refills | Status: DC

## 2023-08-28 MED ORDER — NYSTATIN 100000 UNIT/ML MT SUSP
5.0000 mL | Freq: Four times a day (QID) | OROMUCOSAL | 0 refills | Status: DC
  Filled 2023-08-28: qty 180, 9d supply, fill #0

## 2023-08-28 NOTE — Progress Notes (Signed)
Established patient visit  Patient: [name]   DOB: 03/14/1996   27 y.o. Male  MRN: [id number] Visit Date: 08/28/2023  Today's healthcare provider: Debera Lat, PA-C   Chief Complaint  Patient presents with   Mouth Lesions    Sore on inside of lip at the bottom, it will get big and then go down  Been there for like a month  mouthsore Subjective     Discussed the use of AI scribe software for clinical note transcription with the patient, who gave verbal consent to proceed.  History of Present Illness   The patient presents with a sore inside their lower lip, which has been inflamed and appears like a blister. They have not intentionally changed their diet recently. The patient has not had similar symptoms since childhood when they had a canker sore. They deny any significant discomfort, but occasionally experience a slight burning sensation. The patient does not have a history of cold sores or immunosuppression.           08/28/2023    2:27 PM 03/12/2023    3:30 PM 09/17/2022    1:56 PM  Depression screen PHQ 2/9  Decreased Interest 1 0   Down, Depressed, Hopeless 1 0   PHQ - 2 Score 2 0   Altered sleeping 1 0   Tired, decreased energy 2 0   Change in appetite 0 0   Feeling bad or failure about yourself  0 0   Trouble concentrating 1 0   Moving slowly or fidgety/restless 1 0   Suicidal thoughts 0 0   PHQ-9 Score 7 0   Difficult doing work/chores Somewhat difficult Not difficult at all      Information is confidential and restricted. Go to Review Flowsheets to unlock data.      01/11/2020    1:44 PM  GAD 7 : Generalized Anxiety Score  Nervous, Anxious, on Edge 0  Control/stop worrying 0  Worry too much - different things 0  Trouble relaxing 0  Restless 0  Easily annoyed or irritable 0  Afraid - awful might happen 0  Total GAD 7 Score 0  Anxiety Difficulty Not difficult at all    Medications: Outpatient Medications Prior to Visit  Medication Sig  Note   busPIRone (BUSPAR) 5 MG tablet Take 1 tablet (5 mg total) by mouth 2 (two) times daily.    DULoxetine (CYMBALTA) 30 MG capsule Take 3 capsules (90 mg total) by mouth daily.    Cholecalciferol (VITAMIN D) 125 MCG (5000 UT) CAPS Take 125 mcg by mouth daily. (Patient not taking: Reported on 08/28/2023) 08/28/2023: Over the counter only   No facility-administered medications prior to visit.    Review of Systems  All other systems reviewed and are negative.  Except see HPI       Objective    BP 118/78 (BP Location: Right Arm, Patient Position: Sitting, Cuff Size: Normal)   Pulse 89   Temp 98.1 F (36.7 C)   Resp 16   Ht 5\' 7"  (1.702 m)   Wt 191 lb 14.4 oz (87 kg)   SpO2 97%   BMI 30.06 kg/m     Physical Exam Constitutional:      General: He is not in acute distress.    Appearance: Normal appearance. He is not diaphoretic.  HENT:     Head: Normocephalic.  Eyes:     Conjunctiva/sclera: Conjunctivae normal.  Pulmonary:     Effort: Pulmonary effort is normal. No respiratory  distress.  Skin:    Findings: Lesion (on the oral mucosa) present.  Neurological:     Mental Status: He is alert and oriented to person, place, and time. Mental status is at baseline.      No results found for any visits on 08/28/23.  Assessment & Plan    1. Mouth sores Oral Ulcer Inflamed, blister-like lesion on the lower lip. No recent dietary changes. Discussed potential exacerbation by salty, spicy, and solid foods. Recommended dietary modifications and a compounded mouthwash. If no improvement in 1-2 weeks, consider referral to ENT.  -Start compounded mouthwash (anesthetic, aluminum, nystatin) up to four times daily after meals. -Modify diet to decrease salty, spicy, and solid foods, hot beverages. -Use a neutral toothpaste and consider using a straw for sour beverages. -If no improvement in 1-2 weeks, return for re-evaluation and possible ENT referral. -Use a peroxide-based mouthwash in  addition to the compounded mouthwash.  No follow-ups on file.     The patient was advised to call back or seek an in-person evaluation if the symptoms worsen or if the condition fails to improve as anticipated.  I discussed the assessment and treatment plan with the patient. The patient was provided an opportunity to ask questions and all were answered. The patient agreed with the plan and demonstrated an understanding of the instructions.  I, Debera Lat, PA-C have reviewed all documentation for this visit. The documentation on 08/28/2023 for the exam, diagnosis, procedures, and orders are all accurate and complete.  Debera Lat, [facility], MMS [facility] [phone] (phone) [phone] (fax)  [facility]

## 2023-09-22 ENCOUNTER — Other Ambulatory Visit: Payer: Self-pay

## 2023-09-22 ENCOUNTER — Telehealth (HOSPITAL_COMMUNITY): Payer: 59 | Admitting: Psychiatry

## 2023-09-22 ENCOUNTER — Encounter (HOSPITAL_COMMUNITY): Payer: Self-pay | Admitting: Psychiatry

## 2023-09-22 DIAGNOSIS — F411 Generalized anxiety disorder: Secondary | ICD-10-CM | POA: Diagnosis not present

## 2023-09-22 DIAGNOSIS — M26609 Unspecified temporomandibular joint disorder, unspecified side: Secondary | ICD-10-CM

## 2023-09-22 MED ORDER — BUSPIRONE HCL 5 MG PO TABS
5.0000 mg | ORAL_TABLET | Freq: Two times a day (BID) | ORAL | 2 refills | Status: DC
Start: 2023-09-22 — End: 2023-11-24
  Filled 2023-09-22: qty 60, 30d supply, fill #0
  Filled 2023-10-27: qty 60, 30d supply, fill #1
  Filled 2023-11-22: qty 60, 30d supply, fill #2

## 2023-09-22 MED ORDER — DULOXETINE HCL 30 MG PO CPEP
90.0000 mg | ORAL_CAPSULE | Freq: Every day | ORAL | 2 refills | Status: AC
Start: 2023-09-22 — End: ?
  Filled 2023-09-22: qty 90, 30d supply, fill #0
  Filled 2023-10-27: qty 90, 30d supply, fill #1
  Filled 2023-11-22: qty 90, 30d supply, fill #2

## 2023-09-22 NOTE — Progress Notes (Signed)
BH MD Outpatient Progress Note  09/22/2023 8:23 AM [name]  MRN:  [id number]  Assessment:  [name] presents for follow-up evaluation. Today, 09/22/23, patient reports improving anxiety with titration of Cymbalta back to 90 mg and outside of the last 2 to 3 weeks had improved TMJ.  Is doing ongoing work with therapy around events from the summer but denies active fights at home. May look to spring before decreasing Cymbalta again in case there is a seasonal component to his mood symptoms.  Caffeine still around 12-24 ounces most days likely providing secondary benefit to his anxiety and TMJ.  He may decrease the dose of BuSpar to 2.5 mg twice a day or 5 mg once a day before next appointment to see if this is still needed.  Follow-up in 2 months.  For safety, his acute risk factors for suicide are: none. His chronic risk factors are: chronic mental illness, history of aborted suicide attempt, past substance use. His protective factors are: supportive family, employment, minor children living in the home, beloved pets, actively seeking and engaging with mental/physical healthcare, contracting for safety, no SI. While future events cannot be fully predicted he is not currently meeting IVC criteria and can continue as an outpatient.   Identifying Information: [name] is a 27 y.o. male with a history of generalized anxiety disorder, MDD in remission with one lifetime aborted suicide attempt at age 3-15, TMJ, caffeine overuse, history of tobacco use disorder, history of cannabis use disorder in sustained remission, and childhood ODD who is an established patient with [facility] participating in follow-up via video conferencing. Initial evaluation of anxiety on 09/17/2022; see that note for full Case formulation.  Patient reported improving jaw pain since starting cymbalta but ongoing anxious thoughts consistent with generalized anxiety disorder. He did  have one lifetime aborted suicide attempt in adolescence and hasn't had SI since in discussion with patient and depression is still in remission. He was using an excessive amount of caffeine daily at 32-40oz and encouraged him to cut back as this may be a perpetuating reason for his anxiousness. He also recently quit smoking in October 2023. Remote use of cannabis last in 2021. He is not currently seeing psychotherapy and with relatively low amounts of daily anxiety it is reasonable to trial just changes above before establishing with therapy. No worsening of his anxiety or return of his depression with discontinuation of Wellbutrin. PCP did check a vitamin D level and he was found to have a vitamin D level of 16 and is on a 5000 units daily supplement. He was also able to find a nightguard that is helpful for his TMJ and only when he is more stressed will he have some of the old jaw pain from daytime bruxism.  Plan:  # Generalized anxiety disorder  Past medication trials: See med trials below Status of problem: Improving Interventions: -- Continue cymbalta to 90mg  daily (s1/12/24, d6/11/24, d9/30/24) -- continue buspar 5mg  bid for now -- Continue lower caffeine use  # TMJ  Caffeine over use Past medication trials:  Status of problem: Chronic and stable Interventions: -- cut back on caffeine -- Continue bite guard  # Vitamin D deficiency Past medication trials:  Status of problem: Improving Interventions: -- Continue vitamin D 5000 units daily supplement  # History of tobacco use disorder in early remission Past medication trials:  Status of problem: In remission Interventions: -- continue to encourage abstinence  # History of cannabis  use disorder in sustained remission Past medication trials:  Status of problem: In remission Interventions: -- continue to encourage abstinence  # Depression, in remission  History of 1 lifetime suicide attempt Past medication trials:  Status  of problem: In remission Interventions: -- continue to monitor for recurrence  Patient was given contact information for behavioral health clinic and was instructed to call 911 for emergencies.   Subjective:  Chief Complaint:  Chief Complaint  Patient presents with   Anxiety   Follow-up    Interval History: Doing alright, things mostly holding steady. Still dealing with past stressors and some new ones but talking with therapist about it and the 90mg  of cymbalta has been more helpful than the 60mg . Took about a week to get back to where they were before. Thanksgiving went well. The last 2-3 weeks had some breakthrough TMJ pain but that was the only time. Still seeing a therapist through [employer]. Caffeine still 24oz daily, somedays an extra cup but others will be one.  May try to change dose of BuSpar to see if still needed before next appointment.  Visit Diagnosis:    ICD-10-CM   1. Generalized anxiety disorder  F41.1 DULoxetine (CYMBALTA) 30 MG capsule    busPIRone (BUSPAR) 5 MG tablet    2. TMJ (temporomandibular joint disorder)  M26.609 DULoxetine (CYMBALTA) 30 MG capsule         Past Psychiatric History:  Diagnoses: generalized anxiety disorder, MDD in remission with one lifetime aborted suicide attempt at age 53-15, TMJ, caffeine overuse, history of tobacco use disorder, history of cannabis use disorder in sustained remission, and childhood ODD Medication trials: cymbalta, wellbutrin, buspar. Zoloft, prozac, celexa.  Previous psychiatrist/therapist: yes Hospitalizations: yes as below Suicide attempts: mother stopped him at age 66-15 with plan to hang himself in woods behind house SIB: cut in late teens and early 77s on thighs, upper arms, wrist Hx of violence towards others: none Current access to guns: rifles, handgun stored in a gun safe Hx of abuse: none Substance use: No recent marijuana use  Past Medical History:  Past Medical History:  Diagnosis Date    Angioedema 09/02/2016   Caffeine overuse 09/17/2022   Depression    History of seizure    Oppositional defiant disorder    Seasonal allergies    Seizures (HCC)    sinle seizure oct/2011   Urticaria    Wears glasses     Past Surgical History:  Procedure Laterality Date   sugery for arm fracture age 65      Family Psychiatric History: maternal great grandmother with depression   Family History:  Family History  Problem Relation Age of Onset   Allergies Mother    Alcohol abuse Father    Heart attack Father    Cancer Father    Allergies Sister    Breast cancer Maternal Grandmother    Depression Paternal Grandmother     Social History:  Social History   Socioeconomic History   Marital status: Married    Spouse name: Not on file   Number of children: Not on file   Years of education: Not on file   Highest education level: Not on file  Occupational History   Not on file  Tobacco Use   Smoking status: Former    Types: Cigars, E-cigarettes    Quit date: 07/2022    Years since quitting: 1.1   Smokeless tobacco: Former    Types: Chew    Quit date: 02/20/2019  Vaping Use  Vaping status: Former  Substance and Sexual Activity   Alcohol use: Not Currently    Comment: previously 1 beer at dinner or 6-7 drinks very infrequently socially   Drug use: Not Currently    Types: Marijuana    Comment: infrequent previously. Last use 2021   Sexual activity: Yes  Other Topics Concern   Not on file  Social History Narrative   Not on file   Social Determinants of Health   Financial Resource Strain: Not on file  Food Insecurity: Not on file  Transportation Needs: Not on file  Physical Activity: Not on file  Stress: Not on file  Social Connections: Not on file    Allergies:  Allergies  Allergen Reactions   Augmentin [Amoxicillin-Pot Clavulanate]     Current Medications: Current Outpatient Medications  Medication Sig Dispense Refill   busPIRone (BUSPAR) 5 MG tablet  Take 1 tablet (5 mg total) by mouth 2 (two) times daily. 60 tablet 2   Cholecalciferol (VITAMIN D) 125 MCG (5000 UT) CAPS Take 125 mcg by mouth daily. (Patient not taking: Reported on 08/28/2023)     DULoxetine (CYMBALTA) 30 MG capsule Take 3 capsules (90 mg total) by mouth daily. 90 capsule 2   No current facility-administered medications for this visit.    ROS: Review of Systems  Musculoskeletal:        TMJ  Psychiatric/Behavioral:  Positive for decreased concentration. Negative for dysphoric mood, hallucinations, self-injury, sleep disturbance and suicidal ideas. The patient is nervous/anxious.     Objective:  Psychiatric Specialty Exam: There were no vitals taken for this visit.There is no height or weight on file to calculate BMI.  General Appearance: Casual, Neat, Well Groomed, and appears stated age, tattoos present  Eye Contact:  Good  Speech:  Clear and Coherent and Normal Rate  Volume:  Normal  Mood:   "Alright, holding steady"  Affect:  Appropriate, Congruent, Constricted, and  cooperative  Thought Content: Logical and Hallucinations: None   Suicidal Thoughts:  No  Homicidal Thoughts:  No  Thought Process:  Coherent, Goal Directed, and Linear  Orientation:  Full (Time, Place, and Person)    Memory:  Immediate;   Good  Judgment:  Good  Insight:  Good  Concentration:  Concentration: Good and Attention Span: Good  Recall:  Good  Fund of Knowledge: Good  Language: Good  Psychomotor Activity:  Normal  Akathisia:  No  AIMS (if indicated): not done  Assets:  Communication Skills Desire for Improvement Financial Resources/Insurance Housing Intimacy Leisure Time Physical Health Resilience Social Support Talents/Skills Transportation Vocational/Educational  ADL's:  Intact  Cognition: WNL  Sleep:  Fair   PE: General: sits comfortably in view of camera; no acute distress  Pulm: no increased work of breathing on room air  MSK: all extremity movements appear  intact  Neuro: no focal neurological deficits observed  Gait & Station: unable to assess by video    Metabolic Disorder Labs: No results found for: "HGBA1C", "MPG" No results found for: "PROLACTIN" Lab Results  Component Value Date   CHOL 206 (H) 04/18/2023   TRIG 186 (H) 04/18/2023   HDL 32 (L) 04/18/2023   CHOLHDL 6.4 (H) 04/18/2023   LDLCALC 140 (H) 04/18/2023   LDLCALC 102 (H) 03/28/2017   Lab Results  Component Value Date   TSH 2.260 10/25/2022   TSH 1.630 03/28/2017    Therapeutic Level Labs: No results found for: "LITHIUM" No results found for: "VALPROATE" No results found for: "CBMZ"  Screenings:  GAD-7    Flowsheet Row Office Visit from 01/11/2020 in [facility]  Total GAD-7 Score 0      PHQ2-9    Flowsheet Row Office Visit from 08/28/2023 in [facility] Office Visit from 03/12/2023 in [facility] Office Visit from 09/17/2022 in [facility] [place] Office Visit from 08/19/2022 in [facility] Office Visit from 11/22/2020 in [facility]  PHQ-2 Total Score 2 0 0 0 0  PHQ-9 Total Score 7 0 -- 0 3      Flowsheet Row ED from 02/17/2023 in [facility] at [facility] Office Visit from 09/17/2022 in [facility] [place]  C-SSRS RISK CATEGORY No Risk No Risk       Collaboration of Care: Collaboration of Care: Medication Management AEB as above  Patient/Guardian was advised Release of Information must be obtained prior to any record release in order to collaborate their care with an outside provider. Patient/Guardian was advised if they have not already done so to contact the registration department to sign all necessary forms in order for [place] to release information regarding their care.   Consent: Patient/Guardian gives verbal consent for treatment and assignment of  benefits for services provided during this visit. Patient/Guardian expressed understanding and agreed to proceed.   Televisit via video: I connected with Hattie on 09/22/23 at  8:00 AM EST by a video enabled telemedicine application and verified that I am speaking with the correct person using two identifiers.  Location: Patient: Home Provider: Home office   I discussed the limitations of evaluation and management by telemedicine and the availability of in person appointments. The patient expressed understanding and agreed to proceed.  I discussed the assessment and treatment plan with the patient. The patient was provided an opportunity to ask questions and all were answered. The patient agreed with the plan and demonstrated an understanding of the instructions.   The patient was advised to call back or seek an in-person evaluation if the symptoms worsen or if the condition fails to improve as anticipated.  I provided 10 minutes of virtual face-to-face time during this encounter.  Elsie Lincoln, MD 09/22/2023, 8:23 AM

## 2023-09-22 NOTE — Patient Instructions (Signed)
We did not make any medication changes today.  You may decrease the dose of BuSpar to 2.5 mg twice a day or 5 mg once a day before next appointment to see if this is still needed.

## 2023-10-27 ENCOUNTER — Other Ambulatory Visit: Payer: Self-pay

## 2023-11-24 ENCOUNTER — Other Ambulatory Visit: Payer: Self-pay

## 2023-11-24 ENCOUNTER — Encounter (HOSPITAL_COMMUNITY): Payer: Self-pay | Admitting: Psychiatry

## 2023-11-24 ENCOUNTER — Telehealth (INDEPENDENT_AMBULATORY_CARE_PROVIDER_SITE_OTHER): Payer: 59 | Admitting: Psychiatry

## 2023-11-24 DIAGNOSIS — M26609 Unspecified temporomandibular joint disorder, unspecified side: Secondary | ICD-10-CM | POA: Diagnosis not present

## 2023-11-24 DIAGNOSIS — F411 Generalized anxiety disorder: Secondary | ICD-10-CM

## 2023-11-24 MED ORDER — BUSPIRONE HCL 5 MG PO TABS
5.0000 mg | ORAL_TABLET | Freq: Two times a day (BID) | ORAL | 2 refills | Status: DC
Start: 2023-11-24 — End: 2024-02-03
  Filled 2023-11-25: qty 60, 30d supply, fill #0
  Filled 2023-12-23: qty 60, 30d supply, fill #1
  Filled 2024-01-21: qty 60, 30d supply, fill #2

## 2023-11-24 MED ORDER — DULOXETINE HCL 30 MG PO CPEP
90.0000 mg | ORAL_CAPSULE | Freq: Every day | ORAL | 2 refills | Status: DC
Start: 2023-11-24 — End: 2024-02-03
  Filled 2023-11-25: qty 90, 30d supply, fill #0
  Filled 2023-12-23: qty 90, 30d supply, fill #1
  Filled 2024-01-21: qty 90, 30d supply, fill #2

## 2023-11-24 NOTE — Progress Notes (Signed)
BH MD Outpatient Progress Note  11/24/2023 8:32 AM [name]  MRN:  [id number]  Assessment:  [name] presents for follow-up evaluation. Today, 11/24/23, patient reports improving anxiety with titration of Cymbalta back to 90 mg and minimal episodes of TMJ.  Is doing ongoing work with therapy around events from the summer and denies active fights at home as he works things through with his wife. May look to spring before decreasing Cymbalta again in case there is a seasonal component to his mood symptoms.  Caffeine still around 12-24 ounces most days likely providing secondary benefit to his anxiety and TMJ.  Follow-up in 2 months.  For safety, his acute risk factors for suicide are: none. His chronic risk factors are: chronic mental illness, history of aborted suicide attempt, past substance use. His protective factors are: supportive family, employment, minor children living in the home, beloved pets, actively seeking and engaging with mental/physical healthcare, contracting for safety, no SI. While future events cannot be fully predicted he is not currently meeting IVC criteria and can continue as an outpatient.   Identifying Information: [name] is a 28 y.o. male with a history of generalized anxiety disorder, MDD in remission with one lifetime aborted suicide attempt at age 60-15, TMJ, caffeine overuse, history of tobacco use disorder, history of cannabis use disorder in sustained remission, and childhood ODD who is an established patient with [facility] participating in follow-up via video conferencing. Initial evaluation of anxiety on 09/17/2022; see that note for full Case formulation.  Patient reported improving jaw pain since starting cymbalta but ongoing anxious thoughts consistent with generalized anxiety disorder. He did have one lifetime aborted suicide attempt in adolescence and hasn't had SI since in discussion with patient and  depression is still in remission. He was using an excessive amount of caffeine daily at 32-40oz and encouraged him to cut back as this may be a perpetuating reason for his anxiousness. He also recently quit smoking in October 2023. Remote use of cannabis last in 2021. He is not currently seeing psychotherapy and with relatively low amounts of daily anxiety it is reasonable to trial just changes above before establishing with therapy. No worsening of his anxiety or return of his depression with discontinuation of Wellbutrin. PCP did check a vitamin D level and he was found to have a vitamin D level of 16 and is on a 5000 units daily supplement. He was also able to find a nightguard that is helpful for his TMJ and only when he is more stressed will he have some of the old jaw pain from daytime bruxism.  Plan:  # Generalized anxiety disorder  Past medication trials: See med trials below Status of problem: Improving Interventions: -- Continue cymbalta to 90mg  daily (s1/12/24, d6/11/24, d9/30/24) -- continue buspar 5mg  bid for now -- Continue lower caffeine use  # TMJ  Caffeine over use Past medication trials:  Status of problem: Improving Interventions: -- cut back on caffeine -- Continue bite guard  # Vitamin D deficiency Past medication trials:  Status of problem: Improving Interventions: -- Continue vitamin D 5000 units daily supplement  # History of tobacco use disorder in early remission Past medication trials:  Status of problem: In remission Interventions: -- continue to encourage abstinence  # History of cannabis use disorder in sustained remission Past medication trials:  Status of problem: In remission Interventions: -- continue to encourage abstinence  # Depression, in remission  History of 1 lifetime suicide  attempt Past medication trials:  Status of problem: In remission Interventions: -- continue to monitor for recurrence  Patient was given contact information  for behavioral health clinic and was instructed to call 911 for emergencies.   Subjective:  Chief Complaint:  Chief Complaint  Patient presents with   Anxiety   Temporomandibular Joint Pain   Follow-up    Interval History: Doing pretty good since last appointment, making some progress. Things are getting a little smoother with talking with his therapist and working through things with his wife. Outside of this still getting through his day to day at work. Minimal breakthrough TMJ pain but only in the setting of forgetting his night guard. Still seeing a therapist through [employer]. Caffeine still 24oz daily or less.  Happy with current doses of medication.  Visit Diagnosis:    ICD-10-CM   1. Generalized anxiety disorder  F41.1 busPIRone (BUSPAR) 5 MG tablet    DULoxetine (CYMBALTA) 30 MG capsule    2. TMJ (temporomandibular joint disorder)  M26.609 DULoxetine (CYMBALTA) 30 MG capsule          Past Psychiatric History:  Diagnoses: generalized anxiety disorder, MDD in remission with one lifetime aborted suicide attempt at age 51-15, TMJ, caffeine overuse, history of tobacco use disorder, history of cannabis use disorder in sustained remission, and childhood ODD Medication trials: cymbalta, wellbutrin, buspar. Zoloft, prozac, celexa.  Previous psychiatrist/therapist: yes Hospitalizations: yes as below Suicide attempts: mother stopped him at age 67-15 with plan to hang himself in woods behind house SIB: cut in late teens and early 69s on thighs, upper arms, wrist Hx of violence towards others: none Current access to guns: rifles, handgun stored in a gun safe Hx of abuse: none Substance use: No recent marijuana use  Past Medical History:  Past Medical History:  Diagnosis Date   Angioedema 09/02/2016   Caffeine overuse 09/17/2022   Depression    History of seizure    Oppositional defiant disorder    Seasonal allergies    Seizures (HCC)    sinle seizure oct/2011   Urticaria     Wears glasses     Past Surgical History:  Procedure Laterality Date   sugery for arm fracture age 87      Family Psychiatric History: maternal great grandmother with depression   Family History:  Family History  Problem Relation Age of Onset   Allergies Mother    Alcohol abuse Father    Heart attack Father    Cancer Father    Allergies Sister    Breast cancer Maternal Grandmother    Depression Paternal Grandmother     Social History:  Social History   Socioeconomic History   Marital status: Married    Spouse name: Not on file   Number of children: Not on file   Years of education: Not on file   Highest education level: Not on file  Occupational History   Not on file  Tobacco Use   Smoking status: Former    Types: Cigars, E-cigarettes    Quit date: 07/2022    Years since quitting: 1.3   Smokeless tobacco: Former    Types: Chew    Quit date: 02/20/2019  Vaping Use   Vaping status: Former  Substance and Sexual Activity   Alcohol use: Not Currently    Comment: previously 1 beer at dinner or 6-7 drinks very infrequently socially   Drug use: Not Currently    Types: Marijuana    Comment: infrequent previously. Last use 2021  Sexual activity: Yes  Other Topics Concern   Not on file  Social History Narrative   Not on file   Social Drivers of Health   Financial Resource Strain: Not on file  Food Insecurity: Not on file  Transportation Needs: Not on file  Physical Activity: Not on file  Stress: Not on file  Social Connections: Not on file    Allergies:  Allergies  Allergen Reactions   Augmentin [Amoxicillin-Pot Clavulanate]     Current Medications: Current Outpatient Medications  Medication Sig Dispense Refill   busPIRone (BUSPAR) 5 MG tablet Take 1 tablet (5 mg total) by mouth 2 (two) times daily. 60 tablet 2   Cholecalciferol (VITAMIN D) 125 MCG (5000 UT) CAPS Take 125 mcg by mouth daily. (Patient not taking: Reported on 08/28/2023)     DULoxetine  (CYMBALTA) 30 MG capsule Take 3 capsules (90 mg total) by mouth daily. 90 capsule 2   No current facility-administered medications for this visit.    ROS: Review of Systems  Musculoskeletal:        TMJ  Psychiatric/Behavioral:  Positive for decreased concentration. Negative for dysphoric mood, hallucinations, self-injury, sleep disturbance and suicidal ideas. The patient is not nervous/anxious.     Objective:  Psychiatric Specialty Exam: There were no vitals taken for this visit.There is no height or weight on file to calculate BMI.  General Appearance: Casual, Neat, Well Groomed, and appears stated age, tattoos present  Eye Contact:  Good  Speech:  Clear and Coherent and Normal Rate  Volume:  Normal  Mood:   "Pretty good, making progress"  Affect:  Appropriate, Congruent, Constricted, and  cooperative  Thought Content: Logical and Hallucinations: None   Suicidal Thoughts:  No  Homicidal Thoughts:  No  Thought Process:  Coherent, Goal Directed, and Linear  Orientation:  Full (Time, Place, and Person)    Memory:  Immediate;   Good  Judgment:  Good  Insight:  Good  Concentration:  Concentration: Good and Attention Span: Good  Recall:  Good  Fund of Knowledge: Good  Language: Good  Psychomotor Activity:  Normal  Akathisia:  No  AIMS (if indicated): not done  Assets:  Communication Skills Desire for Improvement Financial Resources/Insurance Housing Intimacy Leisure Time Physical Health Resilience Social Support Talents/Skills Transportation Vocational/Educational  ADL's:  Intact  Cognition: WNL  Sleep:  Fair   PE: General: sits comfortably in view of camera; no acute distress  Pulm: no increased work of breathing on room air  MSK: all extremity movements appear intact  Neuro: no focal neurological deficits observed  Gait & Station: unable to assess by video    Metabolic Disorder Labs: No results found for: "HGBA1C", "MPG" No results found for:  "PROLACTIN" Lab Results  Component Value Date   CHOL 206 (H) 04/18/2023   TRIG 186 (H) 04/18/2023   HDL 32 (L) 04/18/2023   CHOLHDL 6.4 (H) 04/18/2023   LDLCALC 140 (H) 04/18/2023   LDLCALC 102 (H) 03/28/2017   Lab Results  Component Value Date   TSH 2.260 10/25/2022   TSH 1.630 03/28/2017    Therapeutic Level Labs: No results found for: "LITHIUM" No results found for: "VALPROATE" No results found for: "CBMZ"  Screenings:  GAD-7    Flowsheet Row Office Visit from 01/11/2020 in [facility]  Total GAD-7 Score 0      PHQ2-9    Flowsheet Row Office Visit from 08/28/2023 in [facility] Office Visit from 03/12/2023 in [facility] Office Visit from 09/17/2022 in [facility] [place] Office Visit from 08/19/2022 in [facility] Office Visit from 11/22/2020 in [facility]  PHQ-2 Total Score 2 0 0 0 0  PHQ-9 Total Score 7 0 -- 0 3      Flowsheet Row ED from 02/17/2023 in [facility] at [facility] Office Visit from 09/17/2022 in [facility] at [place]  C-SSRS RISK CATEGORY No Risk No Risk       Collaboration of Care: Collaboration of Care: Medication Management AEB as above  Patient/Guardian was advised Release of Information must be obtained prior to any record release in order to collaborate their care with an outside provider. Patient/Guardian was advised if they have not already done so to contact the registration department to sign all necessary forms in order for [place] to release information regarding their care.   Consent: Patient/Guardian gives verbal consent for treatment and assignment of benefits for services provided during this visit. Patient/Guardian expressed understanding and agreed to proceed.   Televisit via video: I connected with Harsha on 11/24/23 at  8:00 AM EST  by a video enabled telemedicine application and verified that I am speaking with the correct person using two identifiers.  Location: Patient: Home Provider: Home office   I discussed the limitations of evaluation and management by telemedicine and the availability of in person appointments. The patient expressed understanding and agreed to proceed.  I discussed the assessment and treatment plan with the patient. The patient was provided an opportunity to ask questions and all were answered. The patient agreed with the plan and demonstrated an understanding of the instructions.   The patient was advised to call back or seek an in-person evaluation if the symptoms worsen or if the condition fails to improve as anticipated.  I provided 10 minutes of virtual face-to-face time during this encounter.  Elsie Lincoln, MD 11/24/2023, 8:32 AM

## 2023-11-24 NOTE — Patient Instructions (Signed)
 We did not make any medication changes today.  Keep up the good work with self care.

## 2023-11-25 ENCOUNTER — Other Ambulatory Visit: Payer: Self-pay

## 2024-02-03 ENCOUNTER — Telehealth (INDEPENDENT_AMBULATORY_CARE_PROVIDER_SITE_OTHER): Payer: 59 | Admitting: Psychiatry

## 2024-02-03 ENCOUNTER — Encounter (HOSPITAL_COMMUNITY): Payer: Self-pay | Admitting: Psychiatry

## 2024-02-03 ENCOUNTER — Other Ambulatory Visit: Payer: Self-pay

## 2024-02-03 DIAGNOSIS — M26609 Unspecified temporomandibular joint disorder, unspecified side: Secondary | ICD-10-CM | POA: Diagnosis not present

## 2024-02-03 DIAGNOSIS — F411 Generalized anxiety disorder: Secondary | ICD-10-CM

## 2024-02-03 MED ORDER — DULOXETINE HCL 30 MG PO CPEP
90.0000 mg | ORAL_CAPSULE | Freq: Every day | ORAL | 5 refills | Status: DC
Start: 2024-02-03 — End: 2024-08-26
  Filled 2024-02-03 – 2024-03-31 (×2): qty 90, 30d supply, fill #0
  Filled 2024-04-25: qty 90, 30d supply, fill #1
  Filled 2024-05-25: qty 90, 30d supply, fill #2
  Filled 2024-06-24: qty 90, 30d supply, fill #3
  Filled 2024-07-22: qty 90, 30d supply, fill #4

## 2024-02-03 MED ORDER — BUSPIRONE HCL 5 MG PO TABS
5.0000 mg | ORAL_TABLET | Freq: Two times a day (BID) | ORAL | 5 refills | Status: DC
Start: 2024-02-03 — End: 2024-08-26
  Filled 2024-02-03 – 2024-03-31 (×2): qty 60, 30d supply, fill #0
  Filled 2024-04-25: qty 60, 30d supply, fill #1
  Filled 2024-05-25: qty 60, 30d supply, fill #2
  Filled 2024-06-24: qty 60, 30d supply, fill #3
  Filled 2024-07-22: qty 60, 30d supply, fill #4

## 2024-02-03 NOTE — Patient Instructions (Signed)
 We did not make any medication changes today but try to get that new pillow to see sleeping better assists with the stutter worsening.

## 2024-02-03 NOTE — Progress Notes (Signed)
 BH MD Outpatient Progress Note  02/03/2024 8:30 AM [name]  MRN:  [id number]  Assessment:  [name] presents for follow-up evaluation. Today, 02/03/24, patient reports well-controlled anxiety with titration of Cymbalta back to 90 mg in September 2024 and minimal episodes of TMJ.  Is doing ongoing work with therapy around events from the last summer.  Caffeine still around 12-24 ounces most days likely providing secondary benefit to his anxiety and TMJ.  He is planning move in the next few weeks and has been sleeping less than previously which may account for recent worsening of his stutter in the last 1 to 2 months but was not present in session today.  This is noted to be a lifelong issue and will likely have a waxing and waning as relates to stress but we will continue to monitor for possible medication side effect.  No follow-up planned and provide transition discussed.  For safety, his acute risk factors for suicide are: none. His chronic risk factors are: chronic mental illness, history of aborted suicide attempt, past substance use. His protective factors are: supportive family, employment, minor children living in the home, beloved pets, actively seeking and engaging with mental/physical healthcare, contracting for safety, no SI. While future events cannot be fully predicted he is not currently meeting IVC criteria and can continue as an outpatient.   Identifying Information: [name] is a 28 y.o. male with a history of generalized anxiety disorder, MDD in remission with one lifetime aborted suicide attempt at age [age], TMJ, caffeine overuse, history of tobacco use disorder, history of cannabis use disorder in sustained remission, and childhood ODD who is an established patient with [facility] participating in follow-up via video conferencing. Initial evaluation of anxiety on 09/17/2022; see that note for full Case formulation.  Patient  reported improving jaw pain since starting cymbalta but ongoing anxious thoughts consistent with generalized anxiety disorder. He did have one lifetime aborted suicide attempt in adolescence and hasn't had SI since in discussion with patient and depression is still in remission. He was using an excessive amount of caffeine daily at 32-40oz and encouraged him to cut back as this may be a perpetuating reason for his anxiousness. He quit smoking in October 2023. Remote use of cannabis last in 2021. No worsening of his anxiety or return of his depression with discontinuation of Wellbutrin. PCP did check a vitamin D level and he was found to have a vitamin D level of 16 and completed supplement. He was also able to find a nightguard that is helpful for his TMJ and only when he is more stressed will he have some of the old jaw pain from daytime bruxism.  Plan:  # Generalized anxiety disorder  Past medication trials: See med trials below Status of problem: Well-controlled Interventions: -- Continue cymbalta 90mg  daily (s1/12/24, d6/11/24, d9/30/24) -- continue buspar 5mg  bid for now -- Continue lower caffeine use  # TMJ  Caffeine over use Past medication trials:  Status of problem: Well-controlled Interventions: -- cut back on caffeine -- Continue bite guard  # Vitamin D deficiency Past medication trials:  Status of problem: Well-controlled Interventions: -- Plan to resume vitamin D supplement in fall and winter  # History of tobacco use disorder in early remission Past medication trials:  Status of problem: In remission Interventions: -- continue to encourage abstinence  # History of cannabis use disorder in sustained remission Past medication trials:  Status of problem: In remission Interventions: --  continue to encourage abstinence  # Depression, in remission  History of 1 lifetime suicide attempt Past medication trials:  Status of problem: In remission Interventions: --  continue to monitor for recurrence  Patient was given contact information for behavioral health clinic and was instructed to call 911 for emergencies.   Subjective:  Chief Complaint:  Chief Complaint  Patient presents with   Anxiety   Follow-up    Interval History: Things have been pretty alright since last appointment, did end up selling their home yesterday and will be moving in a few weeks. 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 Thinks handling the work/stress well. Minimal breakthrough TMJ pain but only in the setting of forgetting his night guard. Still seeing a therapist through [employer] and going well. Caffeine still 24oz daily or less.  Happy with current doses of medication though has noticed some stuttering in the last month or two (has had lifelong). Started before planning work/housing change and has some issues with sleeping and needs new pillow.   Visit Diagnosis:    ICD-10-CM   1. Generalized anxiety disorder  F41.1 busPIRone (BUSPAR) 5 MG tablet    DULoxetine (CYMBALTA) 30 MG capsule    2. TMJ (temporomandibular joint disorder)  M26.609 DULoxetine (CYMBALTA) 30 MG capsule           Past Psychiatric History:  Diagnoses: generalized anxiety disorder, MDD in remission with one lifetime aborted suicide attempt at age [age], TMJ, caffeine overuse, history of tobacco use disorder, history of cannabis use disorder in sustained remission, and childhood ODD Medication trials: cymbalta, wellbutrin, buspar. Zoloft, prozac, celexa.  Previous psychiatrist/therapist: yes Hospitalizations: yes as below Suicide attempts: mother stopped him at age 14-15 with plan to hang himself in woods behind house SIB: cut in late teens and early 15s on thighs, upper arms, wrist Hx of violence towards others: none Current access to guns: rifles, handgun stored in a gun safe Hx of abuse: none Substance use: No recent marijuana use  Past Medical History:  Past Medical  History:  Diagnosis Date   Angioedema 09/02/2016   Caffeine overuse 09/17/2022   Depression    History of seizure    Oppositional defiant disorder    Seasonal allergies    Seizures (HCC)    sinle seizure oct/2011   Urticaria    Wears glasses     Past Surgical History:  Procedure Laterality Date   sugery for arm fracture age 10      Family Psychiatric History: maternal great grandmother with depression   Family History:  Family History  Problem Relation Age of Onset   Allergies Mother    Alcohol abuse Father    Heart attack Father    Cancer Father    Allergies Sister    Breast cancer Maternal Grandmother    Depression Paternal Grandmother     Social History:  Social History   Socioeconomic History   Marital status: Married    Spouse name: Not on file   Number of children: Not on file   Years of education: Not on file   Highest education level: Not on file  Occupational History   Not on file  Tobacco Use   Smoking status: Former    Types: Cigars, E-cigarettes    Quit date: 07/2022    Years since quitting: 1.5   Smokeless tobacco: Former    Types: Chew    Quit date: 02/20/2019  Vaping Use   Vaping status: Former  Substance and Sexual Activity   Alcohol use: Not Currently    Comment: previously 1 beer at dinner or 6-7 drinks very infrequently socially   Drug use: Not Currently    Types: Marijuana    Comment: infrequent previously. Last use 2021   Sexual activity: Yes  Other Topics Concern   Not on file  Social History Narrative   Not on file   Social Drivers of Health   Financial Resource Strain: Not on file  Food Insecurity: Not on file  Transportation Needs: Not on file  Physical Activity: Not on file  Stress: Not on file  Social Connections: Not on file    Allergies:  Allergies  Allergen Reactions   Augmentin [Amoxicillin-Pot Clavulanate]     Current Medications: Current Outpatient Medications  Medication Sig Dispense Refill   busPIRone  (BUSPAR) 5 MG tablet Take 1 tablet (5 mg total) by mouth 2 (two) times daily. 60 tablet 5   DULoxetine (CYMBALTA) 30 MG capsule Take 3 capsules (90 mg total) by mouth daily. 90 capsule 5   No current facility-administered medications for this visit.    ROS: Review of Systems  Musculoskeletal:        TMJ  Psychiatric/Behavioral:  Positive for decreased concentration. Negative for dysphoric mood, hallucinations, self-injury, sleep disturbance and suicidal ideas. The patient is not nervous/anxious.     Objective:  Psychiatric Specialty Exam: There were no vitals taken for this visit.There is no height or weight on file to calculate BMI.  General Appearance: Casual, Neat, Well Groomed, and appears stated age, tattoos present  Eye Contact:  Good  Speech:  Clear and Coherent and Normal Rate no stutter present in session  Volume:  Normal  Mood:   "Pretty alright doing good"  Affect:  Appropriate, Congruent, Constricted, and  cooperative  Thought Content: Logical and Hallucinations: None   Suicidal Thoughts:  No  Homicidal Thoughts:  No  Thought Process:  Coherent, Goal Directed, and Linear  Orientation:  Full (Time, Place, and Person)    Memory:  Immediate;   Good  Judgment:  Good  Insight:  Good  Concentration:  Concentration: Good and Attention Span: Good  Recall:  Good  Fund of Knowledge: Good  Language: Good  Psychomotor Activity:  Normal  Akathisia:  No  AIMS (if indicated): not done  Assets:  Communication Skills Desire for Improvement Financial Resources/Insurance Housing Intimacy Leisure Time Physical Health Resilience Social Support Talents/Skills Transportation Vocational/Educational  ADL's:  Intact  Cognition: WNL  Sleep:  Fair   PE: General: sits comfortably in view of camera; no acute distress  Pulm: no increased work of breathing on room air  MSK: all extremity movements appear intact  Neuro: no focal neurological deficits observed  Gait & Station:  unable to assess by video    Metabolic Disorder Labs: No results found for: "HGBA1C", "MPG" No results found for: "PROLACTIN" Lab Results  Component Value Date   CHOL 206 (H) 04/18/2023   TRIG 186 (H) 04/18/2023   HDL 32 (L) 04/18/2023   CHOLHDL 6.4 (H) 04/18/2023   LDLCALC 140 (H) 04/18/2023   LDLCALC 102 (H) 03/28/2017   Lab Results  Component Value Date   TSH 2.260 10/25/2022   TSH 1.630 03/28/2017    Therapeutic Level Labs: No results found for: "LITHIUM" No results found for: "VALPROATE" No results found for: "CBMZ"  Screenings:  GAD-7    Flowsheet Row Office Visit from 01/11/2020 in [facility]  Total GAD-7 Score 0  PHQ2-9    Flowsheet Row Office Visit from 08/28/2023 in [facility] Office Visit from 03/12/2023 in [facility] Office Visit from 09/17/2022 in [facility] [place] Office Visit from 08/19/2022 in [facility] Office Visit from 11/22/2020 in [facility]  PHQ-2 Total Score 2 0 0 0 0  PHQ-9 Total Score 7 0 -- 0 3      Flowsheet Row ED from 02/17/2023 in [facility] at [facility] Office Visit from 09/17/2022 in [facility] [place]  C-SSRS RISK CATEGORY No Risk No Risk       Collaboration of Care: Collaboration of Care: Medication Management AEB as above  Patient/Guardian was advised Release of Information must be obtained prior to any record release in order to collaborate their care with an outside provider. Patient/Guardian was advised if they have not already done so to contact the registration department to sign all necessary forms in order for us  to release information regarding their care.   Consent: Patient/Guardian gives verbal consent for treatment and assignment of benefits for services provided during this visit. Patient/Guardian  expressed understanding and agreed to proceed.   Televisit via video: I connected with [name] on 02/03/24 at  8:00 AM EDT by a video enabled telemedicine application and verified that I am speaking with the correct person using two identifiers.  Location: Patient: Home Provider: Home office   I discussed the limitations of evaluation and management by telemedicine and the availability of in person appointments. The patient expressed understanding and agreed to proceed.  I discussed the assessment and treatment plan with the patient. The patient was provided an opportunity to ask questions and all were answered. The patient agreed with the plan and demonstrated an understanding of the instructions.   The patient was advised to call back or seek an in-person evaluation if the symptoms worsen or if the condition fails to improve as anticipated.  I provided 10 minutes of virtual face-to-face time during this encounter.  Madie Schilling, MD 02/03/2024, 8:30 AM

## 2024-02-05 ENCOUNTER — Other Ambulatory Visit (HOSPITAL_COMMUNITY)
Admission: RE | Admit: 2024-02-05 | Discharge: 2024-02-05 | Disposition: A | Discharge status: Home / Self Care | Payer: Self-pay | Source: Ambulatory Visit | Attending: Oncology | Admitting: Oncology

## 2024-02-05 DIAGNOSIS — Z006 Encounter for examination for normal comparison and control in clinical research program: Secondary | ICD-10-CM | POA: Insufficient documentation

## 2024-02-06 ENCOUNTER — Other Ambulatory Visit (HOSPITAL_COMMUNITY)

## 2024-02-16 LAB — GENECONNECT MOLECULAR SCREEN: Genetic Analysis Overall Interpretation: NEGATIVE

## 2024-02-26 ENCOUNTER — Other Ambulatory Visit: Payer: Self-pay

## 2024-03-30 ENCOUNTER — Other Ambulatory Visit: Payer: Self-pay

## 2024-03-30 ENCOUNTER — Other Ambulatory Visit (HOSPITAL_COMMUNITY): Payer: Self-pay

## 2024-03-31 ENCOUNTER — Other Ambulatory Visit: Payer: Self-pay

## 2024-04-18 ENCOUNTER — Encounter: Payer: Self-pay | Admitting: Physician Assistant

## 2024-07-09 ENCOUNTER — Telehealth: Admitting: Physician Assistant

## 2024-07-09 ENCOUNTER — Other Ambulatory Visit: Payer: Self-pay

## 2024-07-09 DIAGNOSIS — J069 Acute upper respiratory infection, unspecified: Secondary | ICD-10-CM

## 2024-07-09 MED ORDER — BENZONATATE 100 MG PO CAPS
100.0000 mg | ORAL_CAPSULE | Freq: Three times a day (TID) | ORAL | 0 refills | Status: DC | PRN
  Filled 2024-07-09: qty 20, 7d supply, fill #0

## 2024-07-09 NOTE — Progress Notes (Signed)

## 2024-07-20 ENCOUNTER — Other Ambulatory Visit: Payer: Self-pay

## 2024-08-23 ENCOUNTER — Telehealth: Payer: Self-pay

## 2024-08-23 DIAGNOSIS — F411 Generalized anxiety disorder: Secondary | ICD-10-CM

## 2024-08-23 DIAGNOSIS — M26609 Unspecified temporomandibular joint disorder, unspecified side: Secondary | ICD-10-CM

## 2024-08-23 NOTE — Telephone Encounter (Unsigned)
 Copied from CRM [id number]. Topic: Clinical - Medication Question >> Aug 23, 2024  1:37 PM Delon T wrote: Reason for CRM: busPIRone  (BUSPAR ) 5 MG tablet DULoxetine  (CYMBALTA ) 30 MG capsule  Psychiatrist no longer in practice and need Janna to take over the prescriptions- [phone]

## 2024-08-26 ENCOUNTER — Other Ambulatory Visit: Payer: Self-pay

## 2024-08-26 MED ORDER — BUSPIRONE HCL 5 MG PO TABS
5.0000 mg | ORAL_TABLET | Freq: Two times a day (BID) | ORAL | 0 refills | Status: DC
  Filled 2024-08-26: qty 60, 30d supply, fill #0

## 2024-08-26 MED ORDER — DULOXETINE HCL 30 MG PO CPEP
90.0000 mg | ORAL_CAPSULE | Freq: Every day | ORAL | 0 refills | Status: DC
  Filled 2024-08-26: qty 90, 30d supply, fill #0

## 2024-08-26 NOTE — Telephone Encounter (Signed)
 Last read by Donnice JONELLE Bars at 11:37AM on 08/26/2024.

## 2024-08-30 ENCOUNTER — Other Ambulatory Visit: Payer: Self-pay

## 2024-09-12 NOTE — Progress Notes (Signed)
 Established patient visit  Patient: [name]   DOB: November 26, 1995   28 y.o. Male  MRN: [id number] Visit Date: 09/13/2024  Today's healthcare provider: Jolynn Spencer, PA-C   No chief complaint on file.  Subjective       Discussed the use of AI scribe software for clinical note transcription with the patient, who gave verbal consent to proceed.  History of Present Illness        08/28/2023    2:27 PM 03/12/2023    3:30 PM 09/17/2022    1:56 PM  Depression screen PHQ 2/9  Decreased Interest 1 0   Down, Depressed, Hopeless 1 0   PHQ - 2 Score 2 0   Altered sleeping 1 0   Tired, decreased energy 2 0   Change in appetite 0 0   Feeling bad or failure about yourself  0 0   Trouble concentrating 1 0   Moving slowly or fidgety/restless 1 0   Suicidal thoughts 0 0   PHQ-9 Score 7  0    Difficult doing work/chores Somewhat difficult Not difficult at all      Information is confidential and restricted. Go to Review Flowsheets to unlock data.   Data saved with a previous flowsheet row definition      01/11/2020    1:44 PM  GAD 7 : Generalized Anxiety Score  Nervous, Anxious, on Edge 0  Control/stop worrying 0  Worry too much - different things 0  Trouble relaxing 0  Restless 0  Easily annoyed or irritable 0  Afraid - awful might happen 0  Total GAD 7 Score 0  Anxiety Difficulty Not difficult at all    Medications: Outpatient Medications Prior to Visit  Medication Sig  . benzonatate  (TESSALON ) 100 MG capsule Take 1 capsule (100 mg total) by mouth 3 (three) times daily as needed.  . busPIRone  (BUSPAR ) 5 MG tablet Take 1 tablet (5 mg total) by mouth 2 (two) times daily.  . DULoxetine  (CYMBALTA ) 30 MG capsule Take 3 capsules (90 mg total) by mouth daily.   No facility-administered medications prior to visit.    Review of Systems  All other systems reviewed and are negative.  All negative Except see HPI   {Insert previous labs (optional):23779} {See past  labs  Heme  Chem  Endocrine  Serology  Results Review (optional):1}   Objective    There were no vitals taken for this visit. {Insert last BP/Wt (optional):23777}{See vitals history (optional):1}   Physical Exam Vitals reviewed.  Constitutional:      General: He is not in acute distress.    Appearance: Normal appearance. He is not diaphoretic.  HENT:     Head: Normocephalic and atraumatic.  Eyes:     General: No scleral icterus.    Conjunctiva/sclera: Conjunctivae normal.  Cardiovascular:     Rate and Rhythm: Normal rate and regular rhythm.     Pulses: Normal pulses.     Heart sounds: Normal heart sounds. No murmur heard. Pulmonary:     Effort: Pulmonary effort is normal. No respiratory distress.     Breath sounds: Normal breath sounds. No wheezing or rhonchi.  Musculoskeletal:     Cervical back: Neck supple.     Right lower leg: No edema.     Left lower leg: No edema.  Lymphadenopathy:     Cervical: No cervical adenopathy.  Skin:    General: Skin is warm and dry.     Findings: No rash.  Neurological:  Mental Status: He is alert and oriented to person, place, and time. Mental status is at baseline.  Psychiatric:        Mood and Affect: Mood normal.        Behavior: Behavior normal.     No results found for any visits on 09/13/24.      Assessment and Plan Assessment & Plan     No orders of the defined types were placed in this encounter.   No follow-ups on file.   The patient was advised to call back or seek an in-person evaluation if the symptoms worsen or if the condition fails to improve as anticipated.  I discussed the assessment and treatment plan with the patient. The patient was provided an opportunity to ask questions and all were answered. The patient agreed with the plan and demonstrated an understanding of the instructions.  I, Jen Benedict, PA-C have reviewed all documentation for this visit. The documentation on 09/13/2024  for the exam,  diagnosis, procedures, and orders are all accurate and complete.  Jolynn Spencer, [facility], MMS [facility] [phone] (phone) [phone] (fax)  [facility]

## 2024-09-13 ENCOUNTER — Other Ambulatory Visit: Payer: Self-pay

## 2024-09-13 ENCOUNTER — Ambulatory Visit: Admitting: Physician Assistant

## 2024-09-13 ENCOUNTER — Encounter: Payer: Self-pay | Admitting: Physician Assistant

## 2024-09-13 VITALS — BP 118/88 | HR 73 | Resp 14 | Ht 67.0 in | Wt 199.0 lb

## 2024-09-13 DIAGNOSIS — F1211 Cannabis abuse, in remission: Secondary | ICD-10-CM

## 2024-09-13 DIAGNOSIS — Z87891 Personal history of nicotine dependence: Secondary | ICD-10-CM

## 2024-09-13 DIAGNOSIS — Z0000 Encounter for general adult medical examination without abnormal findings: Secondary | ICD-10-CM

## 2024-09-13 DIAGNOSIS — F411 Generalized anxiety disorder: Secondary | ICD-10-CM | POA: Diagnosis not present

## 2024-09-13 DIAGNOSIS — E559 Vitamin D deficiency, unspecified: Secondary | ICD-10-CM

## 2024-09-13 DIAGNOSIS — M26609 Unspecified temporomandibular joint disorder, unspecified side: Secondary | ICD-10-CM

## 2024-09-13 DIAGNOSIS — F325 Major depressive disorder, single episode, in full remission: Secondary | ICD-10-CM

## 2024-09-13 MED ORDER — DULOXETINE HCL 30 MG PO CPEP
90.0000 mg | ORAL_CAPSULE | Freq: Every day | ORAL | 0 refills | Status: AC
  Filled 2024-09-13 – 2024-09-29 (×2): qty 270, 90d supply, fill #0

## 2024-09-13 MED ORDER — BUSPIRONE HCL 5 MG PO TABS
5.0000 mg | ORAL_TABLET | Freq: Two times a day (BID) | ORAL | 0 refills | Status: AC
  Filled 2024-09-13 – 2024-09-29 (×2): qty 180, 90d supply, fill #0

## 2024-09-29 ENCOUNTER — Other Ambulatory Visit: Payer: Self-pay

## 2024-11-03 ENCOUNTER — Encounter: Payer: Self-pay | Admitting: Physician Assistant

## 2024-11-15 ENCOUNTER — Encounter: Admitting: Physician Assistant

## 2024-11-15 DIAGNOSIS — F325 Major depressive disorder, single episode, in full remission: Secondary | ICD-10-CM

## 2024-11-15 DIAGNOSIS — F411 Generalized anxiety disorder: Secondary | ICD-10-CM

## 2024-11-15 DIAGNOSIS — Z1159 Encounter for screening for other viral diseases: Secondary | ICD-10-CM

## 2024-11-15 DIAGNOSIS — Z0000 Encounter for general adult medical examination without abnormal findings: Secondary | ICD-10-CM

## 2024-11-15 DIAGNOSIS — E559 Vitamin D deficiency, unspecified: Secondary | ICD-10-CM

## 2025-01-20 ENCOUNTER — Encounter: Admitting: Nurse Practitioner

## 2025-02-10 ENCOUNTER — Ambulatory Visit (HOSPITAL_COMMUNITY): Admitting: Registered Nurse
